# Patient Record
Sex: Male | Born: 1970 | Race: White | Hispanic: No | Marital: Married | Smoking: Current every day smoker
Health system: Southern US, Community
[De-identification: ages and names within clinical notes are randomized; demographics above are authoritative.]

## PROBLEM LIST (undated history)

## (undated) DIAGNOSIS — M549 Dorsalgia, unspecified: Secondary | ICD-10-CM

## (undated) DIAGNOSIS — F17200 Nicotine dependence, unspecified, uncomplicated: Secondary | ICD-10-CM

## (undated) DIAGNOSIS — Z720 Tobacco use: Secondary | ICD-10-CM

## (undated) DIAGNOSIS — M5137 Other intervertebral disc degeneration, lumbosacral region: Secondary | ICD-10-CM

## (undated) DIAGNOSIS — F4322 Adjustment disorder with anxiety: Secondary | ICD-10-CM

## (undated) DIAGNOSIS — R55 Syncope and collapse: Secondary | ICD-10-CM

## (undated) DIAGNOSIS — E119 Type 2 diabetes mellitus without complications: Secondary | ICD-10-CM

## (undated) DIAGNOSIS — M51379 Other intervertebral disc degeneration, lumbosacral region without mention of lumbar back pain or lower extremity pain: Secondary | ICD-10-CM

## (undated) DIAGNOSIS — F419 Anxiety disorder, unspecified: Secondary | ICD-10-CM

## (undated) DIAGNOSIS — R5383 Other fatigue: Secondary | ICD-10-CM

## (undated) DIAGNOSIS — G4733 Obstructive sleep apnea (adult) (pediatric): Secondary | ICD-10-CM

## (undated) HISTORY — DX: Other fatigue: R53.83

## (undated) HISTORY — DX: Other intervertebral disc degeneration, lumbosacral region without mention of lumbar back pain or lower extremity pain: M51.379

## (undated) HISTORY — DX: Obstructive sleep apnea (adult) (pediatric): G47.33

## (undated) HISTORY — DX: Tobacco use: Z72.0

## (undated) HISTORY — DX: Adjustment disorder with anxiety: F43.22

## (undated) HISTORY — DX: Nicotine dependence, unspecified, uncomplicated: F17.200

## (undated) HISTORY — DX: Type 2 diabetes mellitus without complications: E11.9

## (undated) HISTORY — DX: Anxiety disorder, unspecified: F41.9

## (undated) HISTORY — DX: Syncope and collapse: R55

## (undated) HISTORY — DX: Other intervertebral disc degeneration, lumbosacral region: M51.37

## (undated) HISTORY — PX: FINGER SURGERY: SHX640

## (undated) HISTORY — DX: Dorsalgia, unspecified: M54.9

---

## 2002-07-07 ENCOUNTER — Ambulatory Visit (HOSPITAL_BASED_OUTPATIENT_CLINIC_OR_DEPARTMENT_OTHER): Admission: RE | Admit: 2002-07-07 | Discharge: 2002-07-07 | Payer: Self-pay | Admitting: Family Medicine

## 2003-05-23 ENCOUNTER — Ambulatory Visit (HOSPITAL_COMMUNITY): Admission: RE | Admit: 2003-05-23 | Discharge: 2003-05-23 | Payer: Self-pay | Admitting: Orthopedic Surgery

## 2003-05-25 ENCOUNTER — Encounter (INDEPENDENT_AMBULATORY_CARE_PROVIDER_SITE_OTHER): Payer: Self-pay | Admitting: *Deleted

## 2003-05-25 ENCOUNTER — Ambulatory Visit (HOSPITAL_BASED_OUTPATIENT_CLINIC_OR_DEPARTMENT_OTHER): Admission: RE | Admit: 2003-05-25 | Discharge: 2003-05-25 | Payer: Self-pay | Admitting: Orthopedic Surgery

## 2009-03-29 ENCOUNTER — Emergency Department (HOSPITAL_BASED_OUTPATIENT_CLINIC_OR_DEPARTMENT_OTHER): Admission: EM | Admit: 2009-03-29 | Discharge: 2009-03-29 | Payer: Self-pay | Admitting: Emergency Medicine

## 2010-09-20 LAB — POCT CARDIAC MARKERS
CKMB, poc: <1 ng/mL — ABNORMAL LOW (ref 1.0–8.0)
Myoglobin, poc: 57.1 ng/mL (ref 12–200)
Troponin i, poc: <0.05 ng/mL (ref 0.00–0.09)

## 2010-09-20 LAB — COMPREHENSIVE METABOLIC PANEL
ALT: 45 U/L (ref 0–53)
Albumin: 5 g/dL (ref 3.5–5.2)
Alkaline Phosphatase: 97 U/L (ref 39–117)
Potassium: 3.9 mEq/L (ref 3.5–5.1)
Sodium: 143 mEq/L (ref 135–145)
Total Protein: 8 g/dL (ref 6.0–8.3)

## 2010-09-20 LAB — DIFFERENTIAL
Basophils Relative: 2 % — ABNORMAL HIGH (ref 0–1)
Eosinophils Absolute: 0.2 10*3/uL (ref 0.0–0.7)
Lymphs Abs: 2.9 10*3/uL (ref 0.7–4.0)
Monocytes Absolute: 0.5 10*3/uL (ref 0.1–1.0)
Monocytes Relative: 5 % (ref 3–12)
Neutro Abs: 6.1 10*3/uL (ref 1.7–7.7)

## 2010-09-20 LAB — CBC
Platelets: 204 10*3/uL (ref 150–400)
RDW: 12.3 % (ref 11.5–15.5)

## 2010-09-20 LAB — APTT: aPTT: 26 seconds (ref 24–37)

## 2010-09-20 LAB — GLUCOSE, CAPILLARY: Glucose-Capillary: 107 mg/dL — ABNORMAL HIGH (ref 70–99)

## 2010-11-02 NOTE — Op Note (Signed)
NAME:  Christopher Ray, Christopher Ray                        ACCOUNT NO.:  [id number]   MEDICAL RECORD NO.:  [id number]                   PATIENT TYPE:  AMB   LOCATION:  DSC                                  FACILITY:  [facility]   PHYSICIAN:  Artist Pais. Mina Marble, M.D.           DATE OF BIRTH:  [birth year]   DATE OF PROCEDURE:  05/25/2003  DATE OF DISCHARGE:                                 OPERATIVE REPORT   PREOPERATIVE DIAGNOSIS:  Left index finger mass.   POSTOPERATIVE DIAGNOSIS:  Left index finger mass.   PROCEDURE:  Excisional biopsy of left index finger mass.   SURGEON:  Artist Pais. Mina Marble, M.D.   ASSISTANT:  RN.   ANESTHESIA:  General.   TOURNIQUET TIME:  20 minutes.   No complications. No drains.   OPERATIVE REPORT:  The patient was taken to the operating room. After the  induction of anesthesia, left upper extremity was prepped and draped in  usual sterile fashion. Esmarch was used to exsanguinate the limb. Tourniquet  was inflated to 250 mmHg. At this point in time, a longitudinal incision was  made on the radial aspect of the proximal phalanx in between MP and PIP  joints over a large mass. Incision was taken down through the skin and  subcutaneous tissues. The mass seemed to be coming from the area between the  extensor tendon and the lateral band. This was carefully dissected free and  sent for pathological confirmation. Wound was then thoroughly irrigated.  Hemostasis was achieved with bipolar cautery, and the wound was then closed  with 5-0 nylon and simple sutures interrupted. The patient was placed in  sterile dressing with Xeroform, 4 x 4s, and Coban wrap. The patient  tolerated the procedure well and went to recovery in stable fashion.                                               Artist Pais Mina Marble, M.D.    MAW/MEDQ  D:  05/25/2003  T:  05/25/2003  Job:  [id number]

## 2011-07-29 ENCOUNTER — Other Ambulatory Visit: Payer: Self-pay

## 2011-07-29 ENCOUNTER — Emergency Department (INDEPENDENT_AMBULATORY_CARE_PROVIDER_SITE_OTHER): Payer: BC Managed Care – PPO

## 2011-07-29 ENCOUNTER — Encounter (HOSPITAL_BASED_OUTPATIENT_CLINIC_OR_DEPARTMENT_OTHER): Payer: Self-pay | Admitting: *Deleted

## 2011-07-29 ENCOUNTER — Emergency Department (HOSPITAL_BASED_OUTPATIENT_CLINIC_OR_DEPARTMENT_OTHER)
Admission: EM | Admit: 2011-07-29 | Discharge: 2011-07-29 | Disposition: A | Discharge status: Home / Self Care | Payer: BC Managed Care – PPO | Attending: Emergency Medicine | Admitting: Emergency Medicine

## 2011-07-29 DIAGNOSIS — M79609 Pain in unspecified limb: Secondary | ICD-10-CM | POA: Insufficient documentation

## 2011-07-29 DIAGNOSIS — R079 Chest pain, unspecified: Secondary | ICD-10-CM

## 2011-07-29 LAB — BASIC METABOLIC PANEL
BUN: 14 mg/dL (ref 6–23)
CO2: 27 mEq/L (ref 19–32)
Chloride: 103 mEq/L (ref 96–112)
Glucose, Bld: 129 mg/dL — ABNORMAL HIGH (ref 70–99)
Potassium: 3.7 mEq/L (ref 3.5–5.1)
Sodium: 139 mEq/L (ref 135–145)

## 2011-07-29 LAB — CBC
Hemoglobin: 15 g/dL (ref 13.0–17.0)
MCH: 30.9 pg (ref 26.0–34.0)
RBC: 4.85 MIL/uL (ref 4.22–5.81)
WBC: 9.5 10*3/uL (ref 4.0–10.5)

## 2011-07-29 LAB — DIFFERENTIAL
Eosinophils Absolute: 0.2 10*3/uL (ref 0.0–0.7)
Lymphocytes Relative: 25 % (ref 12–46)
Lymphs Abs: 2.4 10*3/uL (ref 0.7–4.0)
Monocytes Relative: 10 % (ref 3–12)
Neutrophils Relative %: 64 % (ref 43–77)

## 2011-07-29 LAB — CARDIAC PANEL(CRET KIN+CKTOT+MB+TROPI)
CK, MB: 2.2 ng/mL (ref 0.3–4.0)
Relative Index: 2.2 (ref 0.0–2.5)
Troponin I: <0.3 ng/mL (ref <0.30)

## 2011-07-29 LAB — TROPONIN I: Troponin I: <0.3 ng/mL (ref <0.30)

## 2011-07-29 MED ORDER — ASPIRIN 325 MG PO TABS: 325.0000 mg | ORAL_TABLET | ORAL | Status: DC

## 2011-07-29 MED ORDER — ASPIRIN 81 MG PO CHEW
CHEWABLE_TABLET | ORAL | Status: AC
  Administered 2011-07-29: 324 mg
  Filled 2011-07-29: qty 4

## 2011-07-29 MED ORDER — ASPIRIN 81 MG PO CHEW: 81.0000 mg | CHEWABLE_TABLET | Freq: Every day | ORAL | Status: AC

## 2011-07-29 NOTE — ED Notes (Signed)
Pt reports sudden onset of mid sternal CP radiating to left arm  and nausea denies SOB vomiting or diaphoresis

## 2011-07-29 NOTE — ED Notes (Signed)
Patient transported to X-ray 

## 2011-07-29 NOTE — ED Provider Notes (Signed)
History     CSN: [id number]  Arrival date & time 07/29/11  0043   First MD Initiated Contact with Patient 07/29/11 0102      Chief Complaint  Patient presents with  . Chest Pain    (Consider location/radiation/quality/duration/timing/severity/associated sxs/prior treatment) HPI Location left sided chest. Radiation to left arm. Quality described as pressure-like and "doesn't feel right". Onset around 11 PM while at rest. Last about 15 minutes. With associated with nausea but no vomiting. No shortness of breath or diaphoresis. No back pain. No leg pain or swelling. No cough cold or congestion. Patient was evaluated over year ago for similar symptoms and was told that may have been a panic attack. He does not get these symptoms frequently. He is a smoker. His father had a CABG in his 65s. Patient has been prescribed fish oil in the past but does not take that any longer. No history of hypertension or diabetes.  History reviewed. No pertinent past medical history.  History reviewed. No pertinent past surgical history.  Family History  Problem Relation Age of Onset  . Diabetes Father   . Heart failure Father     History  Substance Use Topics  . Smoking status: Never Smoker   . Smokeless tobacco: Not on file  . Alcohol Use: No      Review of Systems  Constitutional: Negative for fever and chills.  HENT: Negative for neck pain and neck stiffness.   Eyes: Negative for pain.  Respiratory: Negative for shortness of breath.   Cardiovascular: Positive for chest pain.  Gastrointestinal: Negative for abdominal pain.  Genitourinary: Negative for dysuria.  Musculoskeletal: Negative for back pain.  Skin: Negative for rash.  Neurological: Negative for headaches.  All other systems reviewed and are negative.    Allergies  Review of patient's allergies indicates no known allergies.  Home Medications  No current outpatient prescriptions on file.  BP 134/92  Pulse 97  Temp(Src)  98.2 F (36.8 C) (Oral)  Resp 18  Ht 5\' 9"  (1.753 m)  Wt 192 lb (87.091 kg)  BMI 28.35 kg/m2  SpO2 100%  Physical Exam  Constitutional: He is oriented to person, place, and time. He appears well-developed and well-nourished.  HENT:  Head: Normocephalic and atraumatic.  Eyes: Conjunctivae and EOM are normal. Pupils are equal, round, and reactive to light.  Neck: Trachea normal. Neck supple. No thyromegaly present.  Cardiovascular: Normal rate, regular rhythm, S1 normal, S2 normal and normal pulses.     No systolic murmur is present   No diastolic murmur is present  Pulses:      Radial pulses are 2+ on the right side, and 2+ on the left side.  Pulmonary/Chest: Effort normal and breath sounds normal. He has no wheezes. He has no rhonchi. He has no rales. He exhibits no tenderness.  Abdominal: Soft. Normal appearance and bowel sounds are normal. There is no tenderness. There is no CVA tenderness and negative Murphy's sign.  Musculoskeletal:       BLE:s Calves nontender, no cords or erythema, negative Homans sign  Neurological: He is alert and oriented to person, place, and time. He has normal strength. No cranial nerve deficit or sensory deficit. GCS eye subscore is 4. GCS verbal subscore is 5. GCS motor subscore is 6.  Skin: Skin is warm and dry. No rash noted. He is not diaphoretic.  Psychiatric: His speech is normal.       Cooperative and appropriate    ED Course  Procedures (  including critical care time)  Results for orders placed during the hospital encounter of 07/29/11  CBC      Component Value Range   WBC 9.5  4.0 - 10.5 (K/uL)   RBC 4.85  4.22 - 5.81 (MIL/uL)   Hemoglobin 15.0  13.0 - 17.0 (g/dL)   HCT 16.1  09.6 - 04.5 (%)   MCV 86.6  78.0 - 100.0 (fL)   MCH 30.9  26.0 - 34.0 (pg)   MCHC 35.7  30.0 - 36.0 (g/dL)   RDW 40.9  81.1 - 91.4 (%)   Platelets 201  150 - 400 (K/uL)  DIFFERENTIAL      Component Value Range   Neutrophils Relative 64  43 - 77 (%)   Neutro  Abs 6.0  1.7 - 7.7 (K/uL)   Lymphocytes Relative 25  12 - 46 (%)   Lymphs Abs 2.4  0.7 - 4.0 (K/uL)   Monocytes Relative 10  3 - 12 (%)   Monocytes Absolute 0.9  0.1 - 1.0 (K/uL)   Eosinophils Relative 2  0 - 5 (%)   Eosinophils Absolute 0.2  0.0 - 0.7 (K/uL)   Basophils Relative 0  0 - 1 (%)   Basophils Absolute 0.0  0.0 - 0.1 (K/uL)  BASIC METABOLIC PANEL      Component Value Range   Sodium 139  135 - 145 (mEq/L)   Potassium 3.7  3.5 - 5.1 (mEq/L)   Chloride 103  96 - 112 (mEq/L)   CO2 27  19 - 32 (mEq/L)   Glucose, Bld 129 (*) 70 - 99 (mg/dL)   BUN 14  6 - 23 (mg/dL)   Creatinine, Ser 7.82  0.50 - 1.35 (mg/dL)   Calcium 9.4  8.4 - 95.6 (mg/dL)   GFR calc non Af Amer >90  >90 (mL/min)   GFR calc Af Amer >90  >90 (mL/min)  CARDIAC PANEL(CRET KIN+CKTOT+MB+TROPI)      Component Value Range   Total CK 102  7 - 232 (U/L)   CK, MB 2.2  0.3 - 4.0 (ng/mL)   Troponin I <0.30  <0.30 (ng/mL)   Relative Index 2.2  0.0 - 2.5   TROPONIN I      Component Value Range   Troponin I <0.30  <0.30 (ng/mL)   Dg Chest 2 View  07/29/2011  *RADIOLOGY REPORT*  Clinical Data: Sudden onset of chest pain.  CHEST - 2 VIEW  Comparison: None.  Findings: The heart size is normal.  The lungs are clear.  The visualized soft tissues and bony thorax are unremarkable.  IMPRESSION: Negative chest.  Original Report Authenticated By: Jamesetta Orleans. MATTERN, M.D.     Date: 07/29/2011  Rate: 89  Rhythm: normal sinus rhythm  QRS Axis: normal  Intervals: normal  ST/T Wave abnormalities: nonspecific ST changes  Conduction Disutrbances:none  Narrative Interpretation:   Old EKG Reviewed: unchanged     MDM   Chest pain with low risk for ACS. Aspirin provided. No pain in the ED. Stat EKG reviewed no acute ischemia. Serial cardiac enzymes obtained and reviewed as above all within normal limits.  Patient agrees to close outpatient followup with stress testing. No clinical DVT or PE. Doubt infectious process. No  obvious GI etiology.          Sunnie Nielsen, MD 07/29/11 [phone]

## 2011-08-02 NOTE — ED Notes (Signed)
Patient called to clarify out patient follow up with cardiology.  Reviewed discharge instructions, patient to call for followup appointment with [facility] for possible out patient stress test.

## 2011-08-13 ENCOUNTER — Ambulatory Visit (INDEPENDENT_AMBULATORY_CARE_PROVIDER_SITE_OTHER): Payer: BC Managed Care – PPO | Admitting: Cardiovascular Disease

## 2011-08-13 ENCOUNTER — Encounter: Payer: Self-pay | Admitting: Cardiovascular Disease

## 2011-08-13 DIAGNOSIS — Z720 Tobacco use: Secondary | ICD-10-CM | POA: Insufficient documentation

## 2011-08-13 DIAGNOSIS — R079 Chest pain, unspecified: Secondary | ICD-10-CM | POA: Insufficient documentation

## 2011-08-13 NOTE — Assessment & Plan Note (Addendum)
His risk factors for CAD are family history of CAD and personal history of smoking. His BP is elevated today and his lipid status is unknown. Will arrange echo to assess LV function and exclude structural heart disease. Will also arrange exercise treadmill stress test to exclude ischemia. Will check fasting lipids. Repeat BP at next visit and start anti-hypertensive therapy if BP still elevated.

## 2011-08-13 NOTE — Progress Notes (Signed)
   History of Present Illness: 41 yo WM with no significant past medical history who is here today as a self referral for evaluation of chest pain. He was seen in the ED at [facility] 07/29/11 with c/o chest pain. He describes pressure in the center of his chest, associated with diaphoresis, SOB and feeling poorly. This lasted for about 15 minutes. He was seen in the ED and had negative cardiac enzymes. EKG was ok. He has had several other episodes. He has no history of HTN, HLD, DM. He does smoke and has smoked 1ppd for 17 years. No exertional chest pain.   Primary Care Physician: Windle Guard ( he has not been seen in primary care in years)   Past Medical History  Diagnosis Date  . Chest pain     Past Surgical History  Procedure Date  . Finger surgery     Current Outpatient Prescriptions  Medication Sig Dispense Refill  . fish oil-omega-3 fatty acids 1000 MG capsule Take 1 g by mouth daily.      Marland Kitchen aspirin 81 MG chewable tablet Chew 1 tablet (81 mg total) by mouth daily.  30 tablet  0    No Known Allergies  History   Social History  . Marital Status: Married    Spouse Name: N/A    Number of Children: 2  . Years of Education: N/A   Occupational History  . 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 Smoking status: Current Everyday Smoker -- 1.0 packs/day for 17 years    Types: Cigarettes  . Smokeless tobacco: Not on file  . Alcohol Use: No  . Drug Use: No  . Sexually Active: Not on file   Other Topics Concern  . Not on file   Social History Narrative  . No narrative on file    Family History  Problem Relation Age of Onset  . Heart disease Father     father had CABG in 56's  . Heart attack Father     87s    Review of Systems:  As stated in the HPI and otherwise negative.   BP 149/89  Pulse 102  Ht 5\' 8"  (1.727 m)  Wt 192 lb (87.091 kg)  BMI 29.19 kg/m2  Physical Examination: General: Well developed, well nourished, NAD HEENT: OP  clear, mucus membranes moist SKIN: warm, dry. No rashes. Neuro: No focal deficits Musculoskeletal: Muscle strength 5/5 all ext Psychiatric: Mood and affect normal Neck: No JVD, no carotid bruits, no thyromegaly, no lymphadenopathy. Lungs:Clear bilaterally, no wheezes, rhonci, crackles Cardiovascular: Regular rate and rhythm. No murmurs, gallops or rubs. Abdomen:Soft. Bowel sounds present. Non-tender.  Extremities: No lower extremity edema. Pulses are 2 + in the bilateral DP/PT.  EKG: Sinus tach, rate 102 bpm.

## 2011-08-13 NOTE — Assessment & Plan Note (Signed)
He wishes to stop smoking. Will start Chantix per dosing protocol for 12 weeks.

## 2011-08-13 NOTE — Patient Instructions (Addendum)
Your physician recommends that you schedule a follow-up appointment in: 3-4 weeks.   Your physician has requested that you have an echocardiogram. Echocardiography is a painless test that uses sound waves to create images of your heart. It provides your doctor with information about the size and shape of your heart and how well your heart's chambers and valves are working. This procedure takes approximately one hour. There are no restrictions for this procedure.   Your physician has requested that you have an exercise tolerance test. For further information please visit [email or web address] Please also follow instruction sheet, as given.   Your physician has recommended you make the following change in your medication: Start chantix--use as directed   Your physician recommends that you return for fasting lab work on day of stress test

## 2011-08-19 ENCOUNTER — Other Ambulatory Visit (INDEPENDENT_AMBULATORY_CARE_PROVIDER_SITE_OTHER): Payer: BC Managed Care – PPO

## 2011-08-19 ENCOUNTER — Other Ambulatory Visit: Payer: Self-pay

## 2011-08-19 ENCOUNTER — Ambulatory Visit (HOSPITAL_COMMUNITY): Payer: BC Managed Care – PPO | Attending: Internal Medicine

## 2011-08-19 DIAGNOSIS — R079 Chest pain, unspecified: Secondary | ICD-10-CM | POA: Insufficient documentation

## 2011-08-19 DIAGNOSIS — R072 Precordial pain: Secondary | ICD-10-CM

## 2011-08-19 DIAGNOSIS — F17200 Nicotine dependence, unspecified, uncomplicated: Secondary | ICD-10-CM | POA: Insufficient documentation

## 2011-08-19 DIAGNOSIS — R0609 Other forms of dyspnea: Secondary | ICD-10-CM | POA: Insufficient documentation

## 2011-08-19 DIAGNOSIS — R0989 Other specified symptoms and signs involving the circulatory and respiratory systems: Secondary | ICD-10-CM | POA: Insufficient documentation

## 2011-08-19 LAB — LIPID PANEL
Cholesterol: 186 mg/dL (ref 0–200)
Triglycerides: 154 mg/dL — ABNORMAL HIGH (ref 0.0–149.0)

## 2011-08-20 ENCOUNTER — Telehealth: Payer: Self-pay | Admitting: Cardiovascular Disease

## 2011-08-20 NOTE — Telephone Encounter (Signed)
Spoke with pt and reviewed lipid results and information from Dr. Clifton James

## 2011-08-20 NOTE — Telephone Encounter (Signed)
Fu call °Patient returning your call °

## 2011-08-26 ENCOUNTER — Ambulatory Visit (INDEPENDENT_AMBULATORY_CARE_PROVIDER_SITE_OTHER): Payer: BC Managed Care – PPO | Admitting: Physician Assistant

## 2011-08-26 DIAGNOSIS — R079 Chest pain, unspecified: Secondary | ICD-10-CM

## 2011-08-26 NOTE — Progress Notes (Signed)
Exercise Treadmill Test  Pre-Exercise Testing Evaluation Rhythm: normal sinus  Rate: 90   PR:  .12 QRS:  .08  QT:  .34 QTc: .42     Test  Exercise Tolerance Test Ordering MD: Melene Muller, MD  Interpreting MD:  Tereso Newcomer PA-C  Unique Test No: 1  Treadmill:  1  Indication for ETT: chest pain - rule out ischemia  Contraindication to ETT: No   Stress Modality: exercise - treadmill  Cardiac Imaging Performed: non   Protocol: standard Bruce - maximal  Max BP:  172/77  Max MPHR (bpm):  181 85% MPR (bpm):  153  MPHR obtained (bpm): 160 % MPHR obtained:  87%  Reached 85% MPHR (min:sec):  7:49 Total Exercise Time (min-sec):  8:19  Workload in METS:  10.1 Borg Scale: 17  Reason ETT Terminated:  patient's desire to stop    ST Segment Analysis At Rest: normal ST segments - no evidence of significant ST depression With Exercise: no evidence of significant ST depression  Other Information Arrhythmia:  No Angina during ETT:  absent (0) Quality of ETT:  diagnostic  ETT Interpretation:  normal - no evidence of ischemia by ST analysis  Comments: Good exercise tolerance. No chest pain. Normal BP response to exercise. No ST-T changes to suggest ischemia.   Recommendations: Follow up with Dr. Verne Carrow as directed. Tereso Newcomer, PA-C  3:16 PM 08/26/2011

## 2011-09-09 ENCOUNTER — Ambulatory Visit: Payer: BC Managed Care – PPO | Admitting: Cardiovascular Disease

## 2012-01-22 ENCOUNTER — Emergency Department (HOSPITAL_COMMUNITY)
Admission: EM | Admit: 2012-01-22 | Discharge: 2012-01-22 | Disposition: A | Discharge status: Home Health | Payer: BC Managed Care – PPO | Attending: Emergency Medicine | Admitting: Emergency Medicine

## 2012-01-22 ENCOUNTER — Emergency Department (HOSPITAL_COMMUNITY): Payer: BC Managed Care – PPO

## 2012-01-22 ENCOUNTER — Encounter (HOSPITAL_COMMUNITY): Payer: Self-pay | Admitting: Emergency Medicine

## 2012-01-22 ENCOUNTER — Other Ambulatory Visit: Payer: Self-pay

## 2012-01-22 DIAGNOSIS — R079 Chest pain, unspecified: Secondary | ICD-10-CM | POA: Insufficient documentation

## 2012-01-22 DIAGNOSIS — F411 Generalized anxiety disorder: Secondary | ICD-10-CM | POA: Insufficient documentation

## 2012-01-22 DIAGNOSIS — F17200 Nicotine dependence, unspecified, uncomplicated: Secondary | ICD-10-CM | POA: Insufficient documentation

## 2012-01-22 DIAGNOSIS — F419 Anxiety disorder, unspecified: Secondary | ICD-10-CM

## 2012-01-22 LAB — BASIC METABOLIC PANEL
BUN: 12 mg/dL (ref 6–23)
Creatinine, Ser: 0.92 mg/dL (ref 0.50–1.35)
GFR calc Af Amer: >90 mL/min (ref >90)
GFR calc non Af Amer: >90 mL/min (ref >90)
Potassium: 3.6 mEq/L (ref 3.5–5.1)

## 2012-01-22 LAB — CBC
Hemoglobin: 14.8 g/dL (ref 13.0–17.0)
MCHC: 35.2 g/dL (ref 30.0–36.0)
RDW: 12.5 % (ref 11.5–15.5)
WBC: 6.5 10*3/uL (ref 4.0–10.5)

## 2012-01-22 LAB — POCT I-STAT TROPONIN I: Troponin i, poc: 0 ng/mL (ref 0.00–0.08)

## 2012-01-22 MED ORDER — LORAZEPAM 1 MG PO TABS
1.0000 mg | ORAL_TABLET | Freq: Once | ORAL | Status: AC
  Administered 2012-01-22: 1 mg via ORAL
  Filled 2012-01-22: qty 1

## 2012-01-22 NOTE — ED Provider Notes (Signed)
History     CSN: [id number]  Arrival date & time 01/22/12  0847   First MD Initiated Contact with Patient 01/22/12 [phone]      Chief Complaint  Patient presents with  . Chest Pain    (Consider location/radiation/quality/duration/timing/severity/associated sxs/prior treatment) Patient is a 41 y.o. male presenting with chest pain.  Chest Pain Primary symptoms include nausea. Pertinent negatives for primary symptoms include no fever, no shortness of breath, no cough, no palpitations, no abdominal pain and no vomiting.  Pertinent negatives for associated symptoms include no numbness.   History from patient. Patient who is a smoker presents with chest pain. He states this started this morning while he was standing at work and lasted for a total of about 10 minutes. It was located to the generalized chest without radiation to the back or abdomen or arm. Pain was described as sharp in nature. No associated shortness of breath, diaphoresis, palpitations, leg swelling or pain. He did have associated nausea without vomiting. Patient does have a positive family history of coronary artery disease. His father had an MI in his late 11s. Patient reports that he has had the same pain in the past. He believes that it may be secondary to anxiety. States "it usually starts when I am by myself thinking about things." He has been seen by cardiology previously for this and has had a negative stress test and echocardiogram within the last 6 months. Symptoms are resolved at present.  Past Medical History  Diagnosis Date  . Chest pain     Past Surgical History  Procedure Date  . Finger surgery     Family History  Problem Relation Age of Onset  . Heart disease Father     father had CABG in 27's  . Heart attack Father     28s    History  Substance Use Topics  . Smoking status: Current Everyday Smoker -- 1.0 packs/day for 17 years    Types: Cigarettes  . Smokeless tobacco: Not on file  . Alcohol Use:  No      Review of Systems  Constitutional: Negative for fever and chills.  Respiratory: Negative for cough and shortness of breath.   Cardiovascular: Positive for chest pain. Negative for palpitations and leg swelling.       Currently CP free  Gastrointestinal: Positive for nausea. Negative for vomiting and abdominal pain.  Skin: Negative for color change and rash.  Neurological: Negative for numbness.  All other systems reviewed and are negative.    Allergies  Review of patient's allergies indicates no known allergies.  Home Medications   Current Outpatient Rx  Name Route Sig Dispense Refill  . ASPIRIN 81 MG PO CHEW Oral Chew 1 tablet (81 mg total) by mouth daily. 30 tablet 0    BP 136/84  Pulse 101  Temp 98 F (36.7 C) (Oral)  Resp 20  SpO2 97%  Physical Exam  Nursing note and vitals reviewed. Constitutional: He appears well-developed and well-nourished. No distress.  HENT:  Head: Normocephalic and atraumatic.  Eyes:       Normal appearance  Neck: Normal range of motion.  Cardiovascular: Normal rate, regular rhythm and normal heart sounds.        Initially tachycardic with rate ~108, rate of 90 on my exam  Pulmonary/Chest: Effort normal and breath sounds normal. He exhibits no tenderness.  Abdominal: Soft. Bowel sounds are normal. There is no tenderness. There is no rebound and no guarding.  Musculoskeletal: Normal range  of motion. He exhibits no edema.  Neurological: He is alert.  Skin: Skin is warm and dry. He is not diaphoretic.  Psychiatric: He has a normal mood and affect.    ED Course  Procedures (including critical care time)   Date: 01/22/2012  Rate: 76  Rhythm: normal sinus rhythm  QRS Axis: normal  Intervals: normal  ST/T Wave abnormalities: normal  Conduction Disutrbances:none  Narrative Interpretation: early precordial R wave progression  Old EKG Reviewed: none available  Labs Reviewed  BASIC METABOLIC PANEL - Abnormal; Notable for the  following:    Glucose, Bld 111 (*)     All other components within normal limits  CBC  POCT I-STAT TROPONIN I   Dg Chest 2 View  01/22/2012  *RADIOLOGY REPORT*  Clinical Data: Chest discomfort  CHEST - 2 VIEW  Comparison: 07/29/2011  Findings: The lungs are clear without focal consolidation, edema, effusion or pneumothorax.  Cardiopericardial silhouette is within normal limits for size.  Imaged bony structures of the thorax are intact.  IMPRESSION: No acute cardiopulmonary process.  Stable exam.  Original Report Authenticated By: ERIC A. MANSELL, M.D.     1. Chest pain   2. Anxiety       MDM  Patient presents with reported 10 minute episode of chest pain this morning. Associated with nausea. No other symptoms. He has had similar before and has been seen by cardiology for this and had a. Her workup including an echocardiogram and a stress test which were both negative. These were performed within the past 6 months. Patient is currently pain free. EKG is nonischemic appearing. Patient's lab evaluation and x-rays are normal appearing. Patient was given Ativan and felt better with this. Question whether some of this may be anxiety related. I do not suspect ACS, dissection or pulmonary embolus as the etiology for the patient's symptoms. Patient instructed to make a followup appointment with his primary care doctor for further evaluation and treatment. Reasons to return discussed.        Grant Fontana, PA-C 01/22/12 1307

## 2012-01-22 NOTE — ED Provider Notes (Signed)
Medical screening examination/treatment/procedure(s) were performed by non-physician practitioner and as supervising physician I was immediately available for consultation/collaboration.   Jayceon Troy M Brookelynne Dimperio, MD 01/22/12 2139 

## 2012-01-22 NOTE — ED Notes (Signed)
Pt c/o midsternal CP with N/V starting today; pt denies SOB

## 2012-01-30 ENCOUNTER — Ambulatory Visit (INDEPENDENT_AMBULATORY_CARE_PROVIDER_SITE_OTHER): Payer: BC Managed Care – PPO | Admitting: Nurse Practitioner

## 2012-01-30 ENCOUNTER — Encounter: Payer: Self-pay | Admitting: Nurse Practitioner

## 2012-01-30 VITALS — BP 122/82 | HR 91 | Ht 68.0 in | Wt 186.8 lb

## 2012-01-30 DIAGNOSIS — Z720 Tobacco use: Secondary | ICD-10-CM

## 2012-01-30 DIAGNOSIS — R55 Syncope and collapse: Secondary | ICD-10-CM

## 2012-01-30 DIAGNOSIS — R079 Chest pain, unspecified: Secondary | ICD-10-CM

## 2012-01-30 DIAGNOSIS — F17200 Nicotine dependence, unspecified, uncomplicated: Secondary | ICD-10-CM

## 2012-01-30 NOTE — Patient Instructions (Addendum)
Your physician recommends that you continue on your current medications as directed. Please refer to the Current Medication list given to you today.  Your physician has recommended that you wear 21 DAY event monitor. Event monitors are medical devices that record the heart's electrical activity. Doctors most often [place] these monitors to diagnose arrhythmias. Arrhythmias are problems with the speed or rhythm of the heartbeat. The monitor is a small, portable device. You can wear one while you do your normal daily activities. This is usually used to diagnose what is causing palpitations/syncope (passing out). Per Ward Givens, NP, DX: pre syncope  Your physician recommends that you schedule a follow-up appointment with Dr. Clifton James in one month

## 2012-01-30 NOTE — Progress Notes (Signed)
Patient Name: Christopher Ray Date of Encounter: 01/30/2012  Primary Care Provider:  Kaleen Mask, MD Primary Cardiologist:  C. Clifton James, MD  Patient Profile  41 y/o male with h/o chest pain who presents for f/u.  Problem List   Past Medical History  Diagnosis Date  . Chest pain     a. 08/2011 Echo: EF 60-65%, nl wall motion;  b. 08/2011 Normal ETT:  walked 8:19 w/o ST/T changes.  . Tobacco abuse   . Fatigue   . Pre-syncope    Past Surgical History  Procedure Date  . Finger surgery     Allergies  No Known Allergies  HPI  41 y/o male with the above problem list.  He was last seen in Feb of this year with complaints of chest pain.  This was followed by an echo and ETT, both of which were normal.  Since then, he has continued to have intermittent fatigue, wkns, dizziness/lightheadedness/presyncope, nausea, vomiting, and sharp shooting chest pains.  He can't really pin down how frequently any of these Ss occur, saying only that any one symptom may come on suddenly.  He may go a week or so w/o any symptoms and other times he has dizzy spells several days in a row.  He has never passed out.  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  He was recently seen in the ED (last week) following a sudden episode of nausea and vomiting, and work up there was unrevealing.  Home Medications  Prior to Admission medications   Medication Sig Start Date End Date Taking? Authorizing Provider  aspirin 81 MG chewable tablet Chew 1 tablet (81 mg total) by mouth daily. 07/29/11 07/28/12 Yes Sunnie Nielsen, MD  fish oil-omega-3 fatty acids 1000 MG capsule Take 1 g by mouth daily.   Yes Historical Provider, MD    Review of Systems  As above, pt has periodic sharp/fleeting chest  pain, occasional fatigue/wkns, periodic and sudden nausea and vomiting, occas LH/presyncope.  All other systems reviewed and are otherwise negative except as noted above.  Physical Exam  Blood pressure 122/82, pulse 91, height 5\' 8"  (1.727 m), weight 186 lb 12.8 oz (84.732 kg).  General: Pleasant, NAD Psych: Normal affect. Neuro: Alert and oriented X 3. Moves all extremities spontaneously. HEENT: Normal  Neck: Supple without bruits or JVD. Lungs:  Resp regular and unlabored, CTA. Heart: RRR no s3, s4, or murmurs. Abdomen: Soft, non-tender, non-distended, BS + x 4.  Extremities: No clubbing, cyanosis or edema. DP/PT/Radials 2+ and equal bilaterally.  Accessory Clinical Findings  ECG - RSR, 90, no acute st/t changes.  Assessment & Plan  1.  Presyncope:  Pt with fairly vague symptoms of intermittent and sometimes sudden fatigue/wkns, dizziness, pre-syncope, nausea, vomiting.  He has never passed out.  He has never had palpitations associated with any of these symptoms.  We will place a 21 day event monitor to hopefully exclude arrhythmia as a possible contributor, specifically to his presyncope.  He has already had a nl echo and ETT in March of this year.  2.  Chest pain:  He has a fairly long h/o atypical chest pain and at this point, if anything, this has improved.  Negative ETT in March.  3.  Tobacco abuse:  Cessation advised.  He says that he has cut back but does not have specific plans to quite.  4.  Anxiety:  Likely playing a role in his Ss.  He plans to w/u with his PCP.  5.  Dispo:  F/u Dr. Clifton James in approx 1 month.  Nicolasa Ducking, NP 01/30/2012, 12:47 PM

## 2012-01-31 ENCOUNTER — Encounter (INDEPENDENT_AMBULATORY_CARE_PROVIDER_SITE_OTHER): Payer: BC Managed Care – PPO

## 2012-01-31 DIAGNOSIS — R079 Chest pain, unspecified: Secondary | ICD-10-CM

## 2012-01-31 DIAGNOSIS — R55 Syncope and collapse: Secondary | ICD-10-CM

## 2012-02-25 ENCOUNTER — Encounter: Payer: Self-pay | Admitting: Internal Medicine

## 2012-02-25 ENCOUNTER — Ambulatory Visit (INDEPENDENT_AMBULATORY_CARE_PROVIDER_SITE_OTHER): Payer: BC Managed Care – PPO | Admitting: Internal Medicine

## 2012-02-25 VITALS — BP 120/82 | HR 84 | Temp 97.9°F | Ht 69.25 in | Wt 187.0 lb

## 2012-02-25 DIAGNOSIS — F419 Anxiety disorder, unspecified: Secondary | ICD-10-CM

## 2012-02-25 DIAGNOSIS — F411 Generalized anxiety disorder: Secondary | ICD-10-CM

## 2012-02-25 DIAGNOSIS — M2550 Pain in unspecified joint: Secondary | ICD-10-CM

## 2012-02-25 MED ORDER — ALPRAZOLAM 0.5 MG PO TABS: 0.5000 mg | ORAL_TABLET | Freq: Three times a day (TID) | ORAL | Status: AC | PRN

## 2012-02-25 NOTE — Progress Notes (Signed)
  Subjective:    Patient ID: Christopher Ray, male    DOB: 19-Mar-1971, 41 y.o.   MRN: [id number]  HPI New patient One-year history of on and off episodes described as follows: Feels like everything is racing, his heart goes rapid, he can't relax, "I need to get out off the house like everything is closing in". Sometimes symptoms are associated with sweats and dizziness. No associated headache, nausea, vomiting, loss of consciousness. No obvious triggers. Symptoms may be weekly or monthly, they are unpredictable. Status post cardiology eval,  chart is reviewed, see assessment and plan.  Also for the last 4 months has been having aches and pains, the patient has a hard time describing them, "like a discomfort" he points to his wrists , hands and calves. Denies any fever chills, no weight loss. No neck or back pain.  Past Medical History  Diagnosis Date  . Chest pain     a. 08/2011 Echo: EF 60-65%, nl wall motion;  b. 08/2011 Normal ETT:  walked 8:19 w/o ST/T changes.  . Tobacco abuse   . Fatigue   . Pre-syncope    Past Surgical History  Procedure Date  . Finger surgery    History   Social History  . Marital Status: Married    Spouse Name: N/A    Number of Children: 2  . Years of Education: N/A   Occupational History  . 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 Smoking status: Current Everyday Smoker -- 1.0 packs/day for 17 years    Types: Cigarettes  . Smokeless tobacco: Never Used  . Alcohol Use: Yes     rarely   . Drug Use: No  . Sexually Active: Not on file   Other Topics Concern  . Not on file   Social History Narrative   Divorced, 2 children--- works at [employer]   Family History  Problem Relation Age of Onset  . Heart disease Father     father had CABG in 45's  . Heart attack Father     19s  . Diabetes      F, GM  . Colon cancer Neg Hx   . Prostate cancer Neg Hx       Review of Systems See history of present illness   Objective:   Physical Exam  General -- alert, well-developed, and well-nourished.   Neck --no thyromegaly , normal carotid pulse Lungs -- normal respiratory effort, no intercostal retractions, no accessory muscle use, and normal breath sounds.   Heart-- normal rate, regular rhythm, no murmur, and no gallop.   Abdomen--soft, non-tender, no distention, no masses, no HSM, no guarding, and no rigidity.   Extremities-- no pretibial edema bilaterally; hands and wrists normal to inspection and palpation Neurologic-- alert & oriented X3 ; DTRs and strength normal in all extremities. Speech and gait normal Psych-- Cognition and judgment appear intact. Alert and cooperative with normal attention span and concentration.  Slightly anxious but not depressed appearing.       Assessment & Plan:   Today , I spent more than 30  min with the patient, >50% of the time counseling, and /or reviewing the chart and labs ordered by other providers

## 2012-02-25 NOTE — Assessment & Plan Note (Addendum)
One year history of episodes as described above. Status post cardiology evaluation, in the last few months he had a normal echocardiogram, stress test, BMP, CBC. Last month he did a event monitor and was told it was negative. The differential diagnosis for his episodes definitely include anxiety; other consideration is thyroid disease. I think is reasonable to start anxiety treatment on a trial basis. We discussed SSRIs versus Xanax as needed, he does not like to take anything daily consequently we will start Xanax when necessary. Also will check TSH. Reassess in 4-6 weeks

## 2012-02-25 NOTE — Assessment & Plan Note (Signed)
Ill-defined discomfort in the wrists and calves, exam without synovitis. Will check a sedimentation rate and a total CK. Reassess and return to the office

## 2012-02-25 NOTE — Patient Instructions (Addendum)
If you have anxiety, try Xanax up to 3 times a day. Will cause drowsiness, do not operate machinery  afterwards. Come back in 4-6 weeks for a physical exam.

## 2012-02-26 ENCOUNTER — Ambulatory Visit: Payer: BC Managed Care – PPO | Admitting: Internal Medicine

## 2012-02-26 LAB — TSH: TSH: 1.63 u[IU]/mL (ref 0.35–5.50)

## 2012-02-28 ENCOUNTER — Encounter: Payer: Self-pay | Admitting: *Deleted

## 2012-03-12 ENCOUNTER — Ambulatory Visit: Payer: BC Managed Care – PPO | Admitting: Cardiovascular Disease

## 2012-04-14 ENCOUNTER — Encounter: Payer: Self-pay | Admitting: Internal Medicine

## 2012-04-14 ENCOUNTER — Ambulatory Visit (INDEPENDENT_AMBULATORY_CARE_PROVIDER_SITE_OTHER): Payer: BC Managed Care – PPO | Admitting: Internal Medicine

## 2012-04-14 VITALS — BP 118/74 | HR 103 | Temp 98.4°F | Ht 68.5 in | Wt 184.0 lb

## 2012-04-14 DIAGNOSIS — Z0000 Encounter for general adult medical examination without abnormal findings: Secondary | ICD-10-CM

## 2012-04-14 DIAGNOSIS — F419 Anxiety disorder, unspecified: Secondary | ICD-10-CM

## 2012-04-14 DIAGNOSIS — M2550 Pain in unspecified joint: Secondary | ICD-10-CM

## 2012-04-14 MED ORDER — LORAZEPAM 0.5 MG PO TABS: 0.5000 mg | ORAL_TABLET | Freq: Two times a day (BID) | ORAL | Status: DC | PRN

## 2012-04-14 MED ORDER — PREDNISONE 10 MG PO TABS: ORAL_TABLET | ORAL | Status: DC

## 2012-04-14 NOTE — Assessment & Plan Note (Addendum)
Tdap today Declined a flu shot  Never had a cscope  STE Labs reviewed, will check FLP, AST ALT; also a Testosterone (reports decreased libido) Has a family history of heart disease, recommend a healthy diet and stay active physically.

## 2012-04-14 NOTE — Patient Instructions (Addendum)
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 Watch for drowsiness. ---- Next visit one year and as needed

## 2012-04-14 NOTE — Assessment & Plan Note (Signed)
Today complains of back pain, and no radicular features. Plan: Steroids Tylenol as needed Will call if not better Avoid ice, rather use a heating pad

## 2012-04-14 NOTE — Assessment & Plan Note (Signed)
Patient took Xanax, caused some dizzines the next day. Will try Ativan instead. Either half or one tablet twice a day

## 2012-04-14 NOTE — Progress Notes (Signed)
  Subjective:    Patient ID: Christopher Ray, male    DOB: Oct 30, 1970, 41 y.o.   MRN: [id number]  HPI CPX  Past Medical History  Diagnosis Date  . Chest pain     a. 08/2011 Echo: EF 60-65%, nl wall motion;  b. 08/2011 Normal ETT:  walked 8:19 w/o ST/T changes.  . Tobacco abuse   . Fatigue   . Pre-syncope   . Anxiety    Past Surgical History  Procedure Date  . Finger surgery     L index    History   Social History  . Marital Status: Married    Spouse Name: N/A    Number of Children: 2  . Years of Education: N/A   Occupational History  . 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 Smoking status: Current Every Day Smoker -- 1.0 packs/day for 17 years    Types: Cigarettes  . Smokeless tobacco: Never Used  . Alcohol Use: Yes     rarely   . Drug Use: No  . Sexually Active: Not on file   Other Topics Concern  . Not on file   Social History Narrative   Divorced, 2 children--- works at [employer]: better compared to 3 years ago, has cut down on fried food---exercise: active at work, no routine exercise    Family History  Problem Relation Age of Onset  . Heart disease Father     father had CABG in 88's  . Heart attack Father     13s  . Diabetes      F, GM  . Colon cancer Neg Hx   . Prostate cancer Neg Hx      Review of Systems Since the last time he was here, he took Xanax for  anxiety, the next day he felt dizzy and slightly confused. Denies chest pain or shortness of breath No nausea, vomiting, diarrhea. No blood in the stools. No dysuria or gross hematuria. Also 5 days history of back pain, lower, bilateral, occasional radiation to both legs (posterior tight), worse with certain positions. Denies any fever, chills, rash in the back, bladder or bowel incontinence.     Objective:   Physical Exam General -- alert, well-developed, and well-nourished.   Neck --no thyromegaly  Lungs -- normal respiratory effort, no intercostal  retractions, no accessory muscle use, and normal breath sounds.   Heart-- normal rate, regular rhythm, no murmur, and no gallop.   Abdomen--soft, non-tender, no distention, no masses, no HSM, no guarding, and no rigidity.   Extremities-- no pretibial edema bilaterally Back--slightly tender at the lumbar spine. No tender at the sacroiliac joints Neurologic-- alert & oriented X3, DTRs and strength normal in all extremities. Straight leg test negative. Gait normal, posture are somehow antalgic Psych-- Cognition and judgment appear intact. Alert and cooperative with normal attention span and concentration.  not anxious appearing and not depressed appearing.       Assessment & Plan:

## 2012-04-17 ENCOUNTER — Emergency Department (HOSPITAL_BASED_OUTPATIENT_CLINIC_OR_DEPARTMENT_OTHER): Payer: BC Managed Care – PPO

## 2012-04-17 ENCOUNTER — Encounter (HOSPITAL_BASED_OUTPATIENT_CLINIC_OR_DEPARTMENT_OTHER): Payer: Self-pay

## 2012-04-17 ENCOUNTER — Ambulatory Visit: Payer: BC Managed Care – PPO | Admitting: Family Medicine

## 2012-04-17 ENCOUNTER — Emergency Department (HOSPITAL_BASED_OUTPATIENT_CLINIC_OR_DEPARTMENT_OTHER)
Admission: EM | Admit: 2012-04-17 | Discharge: 2012-04-17 | Disposition: A | Discharge status: Home / Self Care | Payer: BC Managed Care – PPO | Attending: Emergency Medicine | Admitting: Emergency Medicine

## 2012-04-17 ENCOUNTER — Telehealth: Payer: Self-pay

## 2012-04-17 DIAGNOSIS — Z8679 Personal history of other diseases of the circulatory system: Secondary | ICD-10-CM | POA: Insufficient documentation

## 2012-04-17 DIAGNOSIS — Z8669 Personal history of other diseases of the nervous system and sense organs: Secondary | ICD-10-CM | POA: Insufficient documentation

## 2012-04-17 DIAGNOSIS — F17200 Nicotine dependence, unspecified, uncomplicated: Secondary | ICD-10-CM | POA: Insufficient documentation

## 2012-04-17 DIAGNOSIS — M545 Low back pain, unspecified: Secondary | ICD-10-CM | POA: Insufficient documentation

## 2012-04-17 DIAGNOSIS — Z7982 Long term (current) use of aspirin: Secondary | ICD-10-CM | POA: Insufficient documentation

## 2012-04-17 DIAGNOSIS — M549 Dorsalgia, unspecified: Secondary | ICD-10-CM

## 2012-04-17 DIAGNOSIS — Z79899 Other long term (current) drug therapy: Secondary | ICD-10-CM | POA: Insufficient documentation

## 2012-04-17 DIAGNOSIS — F411 Generalized anxiety disorder: Secondary | ICD-10-CM | POA: Insufficient documentation

## 2012-04-17 DIAGNOSIS — Z87828 Personal history of other (healed) physical injury and trauma: Secondary | ICD-10-CM | POA: Insufficient documentation

## 2012-04-17 MED ORDER — KETOROLAC TROMETHAMINE 60 MG/2ML IM SOLN
60.0000 mg | Freq: Once | INTRAMUSCULAR | Status: AC
  Administered 2012-04-17: 60 mg via INTRAMUSCULAR
  Filled 2012-04-17: qty 2

## 2012-04-17 MED ORDER — HYDROCODONE-ACETAMINOPHEN 5-325 MG PO TABS: 2.0000 | ORAL_TABLET | ORAL | Status: DC | PRN

## 2012-04-17 MED ORDER — HYDROMORPHONE HCL PF 2 MG/ML IJ SOLN
2.0000 mg | Freq: Once | INTRAMUSCULAR | Status: AC
  Administered 2012-04-17: 2 mg via INTRAMUSCULAR
  Filled 2012-04-17: qty 1

## 2012-04-17 MED ORDER — CYCLOBENZAPRINE HCL 10 MG PO TABS: 10.0000 mg | ORAL_TABLET | Freq: Two times a day (BID) | ORAL | Status: DC | PRN

## 2012-04-17 NOTE — Telephone Encounter (Signed)
Can you schedule pt an office visit and call pt to advise. See previous notes.   MW

## 2012-04-17 NOTE — Telephone Encounter (Signed)
apparently pain is quite severe, needs to be seen
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
## 2012-04-17 NOTE — ED Notes (Signed)
Pt reports back pain that started 1 week ago.  He was seen by [facility], started on Prednisone but not any better.

## 2012-04-17 NOTE — Telephone Encounter (Signed)
Pt states was in for back pain has been taking the tylenol but not working and getting worse. Pt asked should he come back in or what should he do? Plz advise     MW

## 2012-04-17 NOTE — ED Provider Notes (Signed)
Medical screening examination/treatment/procedure(s) were performed by non-physician practitioner and as supervising physician I was immediately available for consultation/collaboration.  Shelda Jakes, MD 04/17/12 [phone]

## 2012-04-19 ENCOUNTER — Emergency Department (HOSPITAL_BASED_OUTPATIENT_CLINIC_OR_DEPARTMENT_OTHER): Payer: BC Managed Care – PPO

## 2012-04-19 ENCOUNTER — Emergency Department (HOSPITAL_BASED_OUTPATIENT_CLINIC_OR_DEPARTMENT_OTHER)
Admission: EM | Admit: 2012-04-19 | Discharge: 2012-04-19 | Disposition: A | Discharge status: Home / Self Care | Payer: BC Managed Care – PPO | Attending: Emergency Medicine | Admitting: Emergency Medicine

## 2012-04-19 ENCOUNTER — Encounter (HOSPITAL_BASED_OUTPATIENT_CLINIC_OR_DEPARTMENT_OTHER): Payer: Self-pay | Admitting: *Deleted

## 2012-04-19 DIAGNOSIS — Z79899 Other long term (current) drug therapy: Secondary | ICD-10-CM | POA: Insufficient documentation

## 2012-04-19 DIAGNOSIS — R109 Unspecified abdominal pain: Secondary | ICD-10-CM | POA: Insufficient documentation

## 2012-04-19 DIAGNOSIS — M545 Low back pain, unspecified: Secondary | ICD-10-CM | POA: Insufficient documentation

## 2012-04-19 DIAGNOSIS — F411 Generalized anxiety disorder: Secondary | ICD-10-CM | POA: Insufficient documentation

## 2012-04-19 DIAGNOSIS — Z7982 Long term (current) use of aspirin: Secondary | ICD-10-CM | POA: Insufficient documentation

## 2012-04-19 DIAGNOSIS — F17200 Nicotine dependence, unspecified, uncomplicated: Secondary | ICD-10-CM | POA: Insufficient documentation

## 2012-04-19 LAB — URINALYSIS, ROUTINE W REFLEX MICROSCOPIC
Bilirubin Urine: NEGATIVE
Glucose, UA: NEGATIVE mg/dL
Ketones, ur: NEGATIVE mg/dL
Protein, ur: NEGATIVE mg/dL
pH: 6.5 (ref 5.0–8.0)

## 2012-04-19 MED ORDER — ONDANSETRON HCL 4 MG/2ML IJ SOLN
4.0000 mg | Freq: Once | INTRAMUSCULAR | Status: AC
  Administered 2012-04-19: 4 mg via INTRAVENOUS
  Filled 2012-04-19: qty 2

## 2012-04-19 MED ORDER — HYDROMORPHONE HCL PF 1 MG/ML IJ SOLN
1.0000 mg | Freq: Once | INTRAMUSCULAR | Status: AC
  Administered 2012-04-19: 1 mg via INTRAVENOUS
  Filled 2012-04-19: qty 1

## 2012-04-19 MED ORDER — DIAZEPAM 5 MG PO TABS
10.0000 mg | ORAL_TABLET | Freq: Once | ORAL | Status: AC
  Administered 2012-04-19: 5 mg via ORAL
  Filled 2012-04-19: qty 1

## 2012-04-19 NOTE — ED Notes (Signed)
Patients family member has been in hallway twice.  Stating pt is in pain.  Advised Family Member that he is next to be seen.  Family Stated "he is going to rip the room apart if she dont come in."

## 2012-04-19 NOTE — ED Notes (Signed)
Specific instructions given for medication administration and f/u with primary MD on Monday. Spouse verbalized understanding

## 2012-04-19 NOTE — ED Notes (Signed)
Pt presents to ED today with continued back pain.  Pt has been seen here and at [facility] for same

## 2012-04-19 NOTE — ED Provider Notes (Signed)
History     CSN: [id number]  Arrival date & time 04/19/12  1032   First MD Initiated Contact with Patient 04/19/12 1119      Chief Complaint  Patient presents with  . Back Pain    (Consider location/radiation/quality/duration/timing/severity/associated sxs/prior treatment) HPI Pt presents with c/o lower back pain.  Pt states pain is in left lower back.  No known injury.  Has been taking prednisone, pain meds, muscle relaxers.  He states nothing has helped the pain.  No fever, no weakness of legs, no urinary retention or incontinence of bowel or bladder.  There are no other associated systemic symptoms, there are no other alleviating or modifying factors. States pain is worse in the morning after he has been lying in bed at night.    Past Medical History  Diagnosis Date  . Chest pain     a. 08/2011 Echo: EF 60-65%, nl wall motion;  b. 08/2011 Normal ETT:  walked 8:19 w/o ST/T changes.  . Tobacco abuse   . Fatigue   . Pre-syncope   . Anxiety     Past Surgical History  Procedure Date  . Finger surgery     L index     Family History  Problem Relation Age of Onset  . Heart disease Father     father had CABG in 108's  . Heart attack Father     14s  . Diabetes      F, GM  . Colon cancer Neg Hx   . Prostate cancer Neg Hx     History  Substance Use Topics  . Smoking status: Current Every Day Smoker -- 1.0 packs/day for 17 years    Types: Cigarettes  . Smokeless tobacco: Never Used  . Alcohol Use: Yes     Comment: rarely       Review of Systems ROS reviewed and all otherwise negative except for mentioned in HPI  Allergies  Review of patient's allergies indicates no known allergies.  Home Medications   Current Outpatient Rx  Name  Route  Sig  Dispense  Refill  . ASPIRIN 81 MG PO CHEW   Oral   Chew 1 tablet (81 mg total) by mouth daily.   30 tablet   0   . CYCLOBENZAPRINE HCL 10 MG PO TABS   Oral   Take 1 tablet (10 mg total) by mouth 2 (two) times daily as  needed for muscle spasms.   20 tablet   0   . OMEGA-3 FATTY ACIDS 1000 MG PO CAPS   Oral   Take 1 g by mouth daily.         Marland Kitchen HYDROCODONE-ACETAMINOPHEN 5-325 MG PO TABS   Oral   Take 2 tablets by mouth every 4 (four) hours as needed for pain.   20 tablet   0   . LORAZEPAM 0.5 MG PO TABS   Oral   Take 1 tablet (0.5 mg total) by mouth 2 (two) times daily as needed for anxiety.   30 tablet   0   . PREDNISONE 10 MG PO TABS      4 tablets x 2 days, 3 tabs x 2 days, 2 tabs x 2 days, 1 tab x 2 days   20 tablet   0     BP 115/80  Pulse 87  Temp 97.9 F (36.6 C)  Resp 20  SpO2 100% Vitals reviewed Physical Exam Physical Examination: General appearance - alert, uncomfortable appearing- writhing around in bed, throwing pillows,  and in no distress Mental status - alert, oriented to person, place, and time Mouth - mucous membranes moist, pharynx normal without lesions Chest - clear to auscultation, no wheezes, rales or rhonchi, symmetric air entry Heart - normal rate, regular rhythm, normal S1, S2, no murmurs, rubs, clicks or gallops Abdomen - soft, nontender, nondistended, no masses or organomegaly Back exam - no midline tenderness in lumbar spine, paraspinous tenderness in left lower back, no CVA tenderness Neuro- strength 5/5 in extremities x 4, sensation intact Extremities - peripheral pulses normal, no pedal edema, no clubbing or cyanosis Skin - normal coloration and turgor, no rashes ED Course  Procedures (including critical care time)   Labs Reviewed  URINALYSIS, ROUTINE W REFLEX MICROSCOPIC   Ct Abdomen Pelvis Wo Contrast  04/19/2012  *RADIOLOGY REPORT*  Clinical Data: Back pain and abdominal pain.  CT ABDOMEN AND PELVIS WITHOUT CONTRAST  Technique:  Multidetector CT imaging of the abdomen and pelvis was performed following the standard protocol without intravenous contrast.  Comparison: None  Findings: The lung bases are clear.  No pleural effusion or pulmonary  nodule.  The unenhanced appearance of the liver is normal.  No focal lesions or intrahepatic biliary dilatation.  The gallbladder is normal.  No common bile duct dilatation.  The pancreas is normal.  The spleen is normal.  The adrenal glands and kidneys are normal.  No renal or obstructing ureteral calculi and no bladder calculi.  The stomach, duodenum, small bowel and colon are grossly normal without contrast.  The appendix is normal.  The aorta is normal in caliber.  No atherosclerotic changes.  The major branch vessels are patent.  No mesenteric or retroperitoneal mass or adenopathy. There are small scattered lymph nodes.  The bladder, prostate gland and seminal vesicles are unremarkable. No pelvic mass, adenopathy or free pelvic fluid collections.  No inguinal mass or hernia.  The bony structures are unremarkable.  IMPRESSION: No acute abdominal/pelvic findings.  No renal or obstructing ureteral calculi.   Original Report Authenticated By: Rudie Meyer, M.D.    Dg Lumbar Spine Complete  04/17/2012  *RADIOLOGY REPORT*  Clinical Data: Low back pain  LUMBAR SPINE - COMPLETE 4+ VIEW  Comparison: None.  Findings: Five lumbar-type vertebral bodies show minimal scoliosis convex to the left in the thoracolumbar region into the right in the lower lumbar region.  There is mild disc space narrowing at L4- 5.  Other disc heights are normal.  There is mild facet degeneration at L4-5 and L5-S1.  No pars defect or slippage. Sacroiliac joints appear normal.  IMPRESSION: No acute finding.  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   Original Report Authenticated By: Paulina Fusi, M.D.      1. Low back pain       MDM  Pt presenting with continued lower back pain- he is finishing a steroid taper, is taking hydrocodone and muscle relaxers.  No signs or symptoms of cauda equina.  Pt rechecked and felt much improved after IV pain meds.  Urinalysis and CT abd/pelvis obtained due to concern  for possible renal stone due to patients presentation of writhing in bed- these were normal.  Suspect muscular spasm and lumbar films from recent ED visit showed degenerative arthritic changes in lumbar spine.  Pt strongly advised to f/u with his primary doctor if symptoms persist and may need further outpatient imaging.  Discharged with strict return precautions.  Pt agreeable with plan.        Johnny Bridge  Johny Shock, MD 04/19/12 1505

## 2012-04-20 ENCOUNTER — Telehealth: Payer: Self-pay | Admitting: Internal Medicine

## 2012-04-20 ENCOUNTER — Other Ambulatory Visit: Payer: BC Managed Care – PPO

## 2012-04-20 DIAGNOSIS — M549 Dorsalgia, unspecified: Secondary | ICD-10-CM

## 2012-04-20 MED ORDER — HYDROCODONE-ACETAMINOPHEN 5-325 MG PO TABS: 2.0000 | ORAL_TABLET | ORAL | Status: DC | PRN

## 2012-04-20 NOTE — Telephone Encounter (Signed)
Advise patient 1. Will schedule a lumbosacral MRI DX sever  back pain--- please arrange 2. Arrange for orthopedic surgical referral for this week 3. Continue with hydrocodone as prescribed by the ER doctor, call and additional 30 tablets, no refills.

## 2012-04-20 NOTE — Telephone Encounter (Signed)
requesting MRI  ASAP per jonni per [facility] he needs one-callback# [phone]

## 2012-04-20 NOTE — Telephone Encounter (Signed)
Discussed with pt, entered orders, & sent in rx.

## 2012-04-20 NOTE — Telephone Encounter (Signed)
Please advise 

## 2012-04-21 ENCOUNTER — Ambulatory Visit (HOSPITAL_COMMUNITY)
Admission: RE | Admit: 2012-04-21 | Discharge: 2012-04-21 | Disposition: A | Discharge status: Home / Self Care | Payer: BC Managed Care – PPO | Source: Ambulatory Visit | Attending: Internal Medicine | Admitting: Internal Medicine

## 2012-04-21 DIAGNOSIS — M549 Dorsalgia, unspecified: Secondary | ICD-10-CM | POA: Insufficient documentation

## 2012-04-21 NOTE — Telephone Encounter (Signed)
no

## 2012-04-21 NOTE — Telephone Encounter (Signed)
Can you schedule pt an office visit and call pt to advise. See previous notes.   MW  

## 2012-04-21 NOTE — Telephone Encounter (Signed)
Pt is scheduled for MRI. Does he still need OV?

## 2012-04-21 NOTE — Telephone Encounter (Signed)
Lmovm for pt to call office. °

## 2012-06-16 ENCOUNTER — Telehealth: Payer: Self-pay | Admitting: Internal Medicine

## 2012-06-16 DIAGNOSIS — M549 Dorsalgia, unspecified: Secondary | ICD-10-CM

## 2012-06-16 MED ORDER — CYCLOBENZAPRINE HCL 10 MG PO TABS: 10.0000 mg | ORAL_TABLET | Freq: Two times a day (BID) | ORAL | Status: DC | PRN

## 2012-06-16 MED ORDER — LORAZEPAM 0.5 MG PO TABS: 0.5000 mg | ORAL_TABLET | Freq: Two times a day (BID) | ORAL | Status: DC | PRN

## 2012-06-16 NOTE — Telephone Encounter (Signed)
Please advise 

## 2012-06-16 NOTE — Telephone Encounter (Signed)
Patient states he is still in pain and the ortho appts did not help him. He would like a referral to [facility]. Please call pt back at [phone] Pt uese [facility] and needs refills on Ativan Flexeril

## 2012-06-16 NOTE — Telephone Encounter (Signed)
RF done Please arrange a referral to neurosurgery ([facility]) but tell the patient that they probably won't be able to see him because the MRI did not show anything obviously surgical.

## 2012-06-16 NOTE — Telephone Encounter (Signed)
Left message on VM informing patient per Dr.Paz, rx's sent ([facility]), order for referral placed, patient to hear from our referral coordinatior or Neuro office shortly.

## 2012-06-30 ENCOUNTER — Encounter: Payer: Self-pay | Admitting: Family Medicine

## 2012-06-30 ENCOUNTER — Ambulatory Visit (INDEPENDENT_AMBULATORY_CARE_PROVIDER_SITE_OTHER): Payer: BC Managed Care – PPO | Admitting: Family Medicine

## 2012-06-30 VITALS — BP 120/80 | HR 100 | Temp 98.9°F | Wt 180.2 lb

## 2012-06-30 DIAGNOSIS — M5126 Other intervertebral disc displacement, lumbar region: Secondary | ICD-10-CM

## 2012-06-30 MED ORDER — HYDROCODONE-ACETAMINOPHEN 5-325 MG PO TABS: ORAL_TABLET | ORAL | Status: DC

## 2012-06-30 MED ORDER — CYCLOBENZAPRINE HCL 10 MG PO TABS: 10.0000 mg | ORAL_TABLET | Freq: Two times a day (BID) | ORAL | Status: DC | PRN

## 2012-06-30 NOTE — Patient Instructions (Addendum)
Back Pain, Adult Low back pain is very common. About 1 in 5 people have back pain.The cause of low back pain is rarely dangerous. The pain often gets better over time.About half of people with a sudden onset of back pain feel better in just 2 weeks. About 8 in 10 people feel better by 6 weeks.  CAUSES Some common causes of back pain include:  Strain of the muscles or ligaments supporting the spine.  Wear and tear (degeneration) of the spinal discs.  Arthritis.  Direct injury to the back. DIAGNOSIS Most of the time, the direct cause of low back pain is not known.However, back pain can be treated effectively even when the exact cause of the pain is unknown.Answering your caregiver's questions about your overall health and symptoms is one of the most accurate ways to make sure the cause of your pain is not dangerous. If your caregiver needs more information, he or she may order lab work or imaging tests (X-rays or MRIs).However, even if imaging tests show changes in your back, this usually does not require surgery. HOME CARE INSTRUCTIONS For many people, back pain returns.Since low back pain is rarely dangerous, it is often a condition that people can learn to manageon their own.   Remain active. It is stressful on the back to sit or stand in one place. Do not sit, drive, or stand in one place for more than 30 minutes at a time. Take short walks on level surfaces as soon as pain allows.Try to increase the length of time you walk each day.  Do not stay in bed.Resting more than 1 or 2 days can delay your recovery.  Do not avoid exercise or work.Your body is made to move.It is not dangerous to be active, even though your back may hurt.Your back will likely heal faster if you return to being active before your pain is gone.  Pay attention to your body when you bend and lift. Many people have less discomfortwhen lifting if they bend their knees, keep the load close to their bodies,and  avoid twisting. Often, the most comfortable positions are those that put less stress on your recovering back.  Find a comfortable position to sleep. Use a firm mattress and lie on your side with your knees slightly bent. If you lie on your back, put a pillow under your knees.  Only take over-the-counter or prescription medicines as directed by your caregiver. Over-the-counter medicines to reduce pain and inflammation are often the most helpful.Your caregiver may prescribe muscle relaxant drugs.These medicines help dull your pain so you can more quickly return to your normal activities and healthy exercise.  Put ice on the injured area.  Put ice in a plastic bag.  Place a towel between your skin and the bag.  Leave the ice on for 15 to 20 minutes, 3 to 4 times a day for the first 2 to 3 days. After that, ice and heat may be alternated to reduce pain and spasms.  Ask your caregiver about trying back exercises and gentle massage. This may be of some benefit.  Avoid feeling anxious or stressed.Stress increases muscle tension and can worsen back pain.It is important to recognize when you are anxious or stressed and learn ways to manage it.Exercise is a great option. SEEK MEDICAL CARE IF:  You have pain that is not relieved with rest or medicine.  You have pain that does not improve in 1 week.  You have new symptoms.  You are generally   not feeling well. SEEK IMMEDIATE MEDICAL CARE IF:   You have pain that radiates from your back into your legs.  You develop new bowel or bladder control problems.  You have unusual weakness or numbness in your arms or legs.  You develop nausea or vomiting.  You develop abdominal pain.  You feel faint. Document Released: 06/03/2005 Document Revised: 12/03/2011 Document Reviewed: 10/22/2010 Exit[employer] Patient Information 2013 [employer].  

## 2012-06-30 NOTE — Progress Notes (Signed)
  Subjective:    Christopher Ray is a 42 y.o. male who presents for follow up of low back problems. Current symptoms include: numbness in L leg  and pain in L hip (burning, numbing and sharp in character; 10/10 in severity). Symptoms have significantly worsened from the previous visit. Exacerbating factors identified by the patient are bending sideways, recumbency, sitting and standing. Pt has injection scheduled for end of   The following portions of the patient's history were reviewed and updated as appropriate: allergies, current medications, past family history, past medical history, past social history, past surgical history and problem list.    Objective:    BP 120/80  Pulse 100  Temp 98.9 F (37.2 C) (Oral)  Wt 180 lb 3.2 oz (81.738 kg)  SpO2 97% General appearance: alert, cooperative, appears stated age and severe distress Neurologic: Gait: Antalgic   pt unable to sit or stand for long secondary to pain MRI reviewed Assessment:    low back pain probably due to bulging disc L4-5    Plan:    Educational material distributed. Short (2-4 day) period of relative rest recommended until acute symptoms improve. Ice to affected area as needed for local pain relief. Heat to affected area as needed for local pain relief. Muscle relaxants per medication orders. pain meds refilled

## 2012-08-03 ENCOUNTER — Encounter: Payer: Self-pay | Admitting: Lab

## 2012-08-03 ENCOUNTER — Telehealth: Payer: Self-pay | Admitting: Internal Medicine

## 2012-08-03 NOTE — Telephone Encounter (Signed)
To MD and assistant for review.      KP

## 2012-08-03 NOTE — Telephone Encounter (Signed)
Patient Information:  Caller Name: Dnaiel  Phone: [phone]  Patient: Christopher Ray, Christopher Ray  Gender: Male  DOB: 06-03-1971  Age: 42 Years  PCP: Willow Ora  Office Follow Up:  Does the office need to follow up with this patient?: Yes  Instructions For The Office: Per triage, "see PCP today disposition".  Pt declines UC/ED.  Has appt tomorrow but would like to be seen today if possible.  RN Note:  Started taking Lorazepam 0.5mg  as needed in November 2013.  Last took medication on 08/01/12.  Has been feeling tired for the past month.  Thinks it may be from the medication.  C/o decreased energy, mostly at night.  States he has an appt for tomorrow.  Symptoms  Reason For Call & Symptoms: Generalized fatigue, decreased energy, decreased libido.  Reviewed Health History In EMR: Yes  Reviewed Medications In EMR: Yes  Reviewed Allergies In EMR: Yes  Reviewed Surgeries / Procedures: Yes  Date of Onset of Symptoms: 07/03/2012  Guideline(s) Used:  Weakness (Generalized) and Fatigue  Disposition Per Guideline:   See Today in Office  Reason For Disposition Reached:   Moderate weakness (i.e., interferes with work, school, normal activities) and persists > 3 days  Advice Given:  Call Back If:  Unable to stand or walk  Passes out  Breathing difficulty occurs  You become worse.

## 2012-08-03 NOTE — Telephone Encounter (Signed)
Unable to see the patient today. If severe symptoms needs to go to the ER otherwise schedule a visit for tomorrow

## 2012-08-04 ENCOUNTER — Encounter: Payer: Self-pay | Admitting: Internal Medicine

## 2012-08-04 ENCOUNTER — Ambulatory Visit (INDEPENDENT_AMBULATORY_CARE_PROVIDER_SITE_OTHER): Payer: BC Managed Care – PPO | Admitting: Internal Medicine

## 2012-08-04 VITALS — BP 148/84 | HR 97 | Temp 98.1°F | Wt 177.0 lb

## 2012-08-04 DIAGNOSIS — M549 Dorsalgia, unspecified: Secondary | ICD-10-CM | POA: Insufficient documentation

## 2012-08-04 DIAGNOSIS — H539 Unspecified visual disturbance: Secondary | ICD-10-CM | POA: Insufficient documentation

## 2012-08-04 NOTE — Progress Notes (Signed)
  Subjective:    Patient ID: Christopher Ray, male    DOB: 03-Jul-1970, 42 y.o.   MRN: [id number]  HPI He is seen urgently today because visual disturbances. Reports a two-month history of left eye blurred vision, patient has a hard time describing it any better, symptoms are on and off, last 2-6 hours. Dizziness ? Also, had back pain, status post a local injection, doing better.  Past Medical History  Diagnosis Date  . Chest pain     a. 08/2011 Echo: EF 60-65%, nl wall motion;  b. 08/2011 Normal ETT:  walked 8:19 w/o ST/T changes.  . Tobacco abuse   . Fatigue   . Pre-syncope   . Anxiety    Past Surgical History  Procedure Laterality Date  . Finger surgery      L index    History   Social History  . Marital Status: Married    Spouse Name: N/A    Number of Children: 2  . Years of Education: N/A   Occupational History  . 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 Smoking status: Current Every Day Smoker -- 1.00 packs/day for 17 years    Types: Cigarettes  . Smokeless tobacco: Never Used  . Alcohol Use: Yes     Comment: rarely   . Drug Use: No  . Sexually Active: Not on file   Other Topics Concern  . Not on file   Social History Narrative   Divorced, 2 children--- works at [employer]: better compared to 3 years ago, has cut down on fried food---exercise: active at work, no routine exercise     Review of Systems Denies any motor deficits, diplopia, difficulty with his speech. No headaches, nausea, vomiting. Occasional palpitations but no chest pain. Anxiety well controlled with lorazepam.    Objective:   Physical Exam General -- alert, well-developed  Neck -- normal carotid pulse , no bruit  HEENT-- undilated funduscopy grossly normal, EOMI, PERLA, ant chambers wnl Lungs -- normal respiratory effort, no intercostal retractions, no accessory muscle use, and normal breath sounds.   Heart-- normal rate, regular rhythm, no murmur, and  no gallop.   Extremities-- no pretibial edema bilaterally  Neurologic-- alert & oriented X3, speech clear, motor and DTRs symmetric, face symmetric. Gait [employer]. Psych-- Cognition and judgment appear intact. Alert and cooperative with normal attention span and concentration.  not anxious appearing and not depressed appearing.       Assessment & Plan:  Labs were physical exam were never done. Will draw them this week. See instructions.

## 2012-08-04 NOTE — Patient Instructions (Addendum)
Come back fasting: FLP, AST, ALT---- dx v70 Hemoglobin A1c --- hyperglycemia  Free and total testosterone-- decreased libido --- Come back  for a checkup in 2 months

## 2012-08-04 NOTE — Assessment & Plan Note (Signed)
2 months history of visual disturbances, neurological exam is normal. undilated eye exam normal as well. Plan: Check a carotid ultrasound Ophthalmology referral. If symptoms persist or increase patient to let me know.

## 2012-08-04 NOTE — Telephone Encounter (Signed)
Pt has appt scheduled for today. ?

## 2012-08-04 NOTE — Assessment & Plan Note (Signed)
Was recently seen at the ER with back pain, status post a local injection, doing better.

## 2012-08-04 NOTE — Telephone Encounter (Signed)
Pt has appt today

## 2012-08-05 NOTE — Addendum Note (Signed)
Addended by: Edwena Felty T on: 08/05/2012 10:15 AM   Modules accepted: Orders

## 2012-08-07 ENCOUNTER — Other Ambulatory Visit (INDEPENDENT_AMBULATORY_CARE_PROVIDER_SITE_OTHER): Payer: BC Managed Care – PPO

## 2012-08-07 DIAGNOSIS — Z0000 Encounter for general adult medical examination without abnormal findings: Secondary | ICD-10-CM

## 2012-08-07 LAB — HEPATIC FUNCTION PANEL
ALT: 30 U/L (ref 0–53)
Alkaline Phosphatase: 73 U/L (ref 39–117)
Bilirubin, Direct: 0.1 mg/dL (ref 0.0–0.3)
Total Bilirubin: 0.7 mg/dL (ref 0.3–1.2)
Total Protein: 7.5 g/dL (ref 6.0–8.3)

## 2012-08-07 LAB — LIPID PANEL
Cholesterol: 170 mg/dL (ref 0–200)
VLDL: 17.4 mg/dL (ref 0.0–40.0)

## 2012-08-07 LAB — HEMOGLOBIN A1C: Hgb A1c MFr Bld: 5.6 % (ref 4.6–6.5)

## 2012-08-10 LAB — TESTOSTERONE, FREE, TOTAL, SHBG
Sex Hormone Binding: 50 nmol/L (ref 13–71)
Testosterone, Free: 76.5 pg/mL (ref 47.0–244.0)

## 2012-08-12 ENCOUNTER — Encounter: Payer: Self-pay | Admitting: *Deleted

## 2012-10-05 ENCOUNTER — Encounter: Payer: Self-pay | Admitting: Lab

## 2012-10-06 ENCOUNTER — Ambulatory Visit: Payer: BC Managed Care – PPO | Admitting: Internal Medicine

## 2012-10-06 DIAGNOSIS — Z0289 Encounter for other administrative examinations: Secondary | ICD-10-CM

## 2012-11-20 ENCOUNTER — Ambulatory Visit: Payer: BC Managed Care – PPO | Admitting: Internal Medicine

## 2012-11-25 ENCOUNTER — Ambulatory Visit (INDEPENDENT_AMBULATORY_CARE_PROVIDER_SITE_OTHER): Payer: BC Managed Care – PPO | Admitting: Internal Medicine

## 2012-11-25 VITALS — BP 122/84 | HR 111 | Temp 98.1°F | Wt 173.0 lb

## 2012-11-25 DIAGNOSIS — M549 Dorsalgia, unspecified: Secondary | ICD-10-CM

## 2012-11-25 MED ORDER — HYDROCODONE-ACETAMINOPHEN 10-325 MG PO TABS: 1.0000 | ORAL_TABLET | Freq: Three times a day (TID) | ORAL | Status: DC | PRN

## 2012-11-25 MED ORDER — CELECOXIB 200 MG PO CAPS: 200.0000 mg | ORAL_CAPSULE | Freq: Every day | ORAL | Status: DC | PRN

## 2012-11-25 NOTE — Patient Instructions (Addendum)
Stop monitoring, take Celebrex 200 mg one tablet daily with breakfast. Hydrocodone 10 mg, you can take it  up to 3 times a day but may cause drowsiness, you may like to use it only at bedtime

## 2012-11-25 NOTE — Assessment & Plan Note (Signed)
Back pain since November 2013 MRI showed: 1. Small central subligamentous disc protrusion at L4-5 slightly  touching the ventral aspect of the thecal sac but without focal  neural impingement.  2. Tiny central bulge of the L3-4 disc without neural impingement.  Saw Dr. Mikal Plane, was prescribed physical therapy, had 2 epidural shots, the first helped for 3 weeks, the 2nd did not help. Pain is ongoing, worse at night. Is taking "lots of Motrin every day" and  hydrocodone at night only because make him drowsy. Plan: refer to a different practice and get another opinion Change Motrin to Celebrex Increase hydrocodone from 5 mg to 10 mg See instructions Check liver and kidney functions d/t  chronic use of meds

## 2012-11-25 NOTE — Progress Notes (Signed)
  Subjective:    Patient ID: Christopher Ray, male    DOB: 03-Aug-1970, 42 y.o.   MRN: [id number]  HPI Here due to back pain. Back pain started in November 2013, he is getting no relief. Needs some direction.  Past Medical History  Diagnosis Date  . Chest pain     a. 08/2011 Echo: EF 60-65%, nl wall motion;  b. 08/2011 Normal ETT:  walked 8:19 w/o ST/T changes.  . Tobacco abuse   . Fatigue   . Pre-syncope   . Anxiety    Past Surgical History  Procedure Laterality Date  . Finger surgery      L index      Review of Systems No fever or chills. No bladder or bowel incontinence Some urinary frequency at times.     Objective:   Physical Exam  General -- alert, well-developed, Some antalgic posture.    Neurologic-- alert & oriented X3 and strength normal in all extremities. Psych-- Cognition and judgment appear intact. Alert and cooperative with normal attention span and concentration.  some anxiety, not depressed appearing.       Assessment & Plan:

## 2012-11-26 LAB — COMPREHENSIVE METABOLIC PANEL
BUN: 10 mg/dL (ref 6–23)
CO2: 28 mEq/L (ref 19–32)
Calcium: 11.3 mg/dL — ABNORMAL HIGH (ref 8.4–10.5)
Chloride: 102 mEq/L (ref 96–112)
Creatinine, Ser: 0.9 mg/dL (ref 0.4–1.5)
GFR: 94 mL/min (ref >60.00)
Glucose, Bld: 95 mg/dL (ref 70–99)

## 2012-12-04 ENCOUNTER — Telehealth: Payer: Self-pay | Admitting: Internal Medicine

## 2012-12-04 NOTE — Telephone Encounter (Signed)
Patient states we should have received paperwork for his celebrex. He is checking on the status.

## 2012-12-06 ENCOUNTER — Encounter: Payer: Self-pay | Admitting: Internal Medicine

## 2012-12-07 NOTE — Telephone Encounter (Addendum)
Called Pharmacy and obtain PA info. [phone]. PA formed received will complete and fax back.

## 2012-12-08 ENCOUNTER — Telehealth: Payer: Self-pay | Admitting: Internal Medicine

## 2012-12-08 NOTE — Telephone Encounter (Signed)
Please advise 

## 2012-12-08 NOTE — Telephone Encounter (Signed)
PA completed and faxed back 

## 2012-12-08 NOTE — Telephone Encounter (Signed)
Patient is calling to discuss the Celebrex Rx he is taking. States that he feels "out of it" a lot of times and tends to forget where is going lately while driving. Wants to know if this is a symptom of the medication and if he should continue taking.

## 2012-12-08 NOTE — Telephone Encounter (Signed)
Discussed with pt

## 2012-12-08 NOTE — Telephone Encounter (Signed)
I doubt Celebrex is causing those symptoms rather could be hydrocodone, it was increased from 5 mg to 10 mg. Recommend to go back to hydrocodone 5 mg (take 1/2 tab) If symptoms persist, let me know

## 2012-12-14 NOTE — Telephone Encounter (Signed)
Prior Auth Approved 12-08-12-06-16-2038, approval letter scan to chart, pharmacy faxed

## 2012-12-21 ENCOUNTER — Telehealth: Payer: Self-pay | Admitting: Internal Medicine

## 2012-12-21 NOTE — Telephone Encounter (Signed)
I don't recommend more than Celebrex 200 mg daily. He could try hydrocodone or Tylenol 500 mg 2 tablets every 8 hours as needed once Celebrex "weans off" . Also needs to see the back specialist and get some relief.

## 2012-12-21 NOTE — Telephone Encounter (Signed)
Attempted to contact pt, unable to leave message.

## 2012-12-21 NOTE — Telephone Encounter (Signed)
Patient is calling with concerns about his Celebrex Rx. States that the medication is not helping him for the entire day and his symptoms usually return by 2pm after taking it in the morning. Wants to know if his dosage should be increased. Please advise.

## 2012-12-21 NOTE — Telephone Encounter (Signed)
Please advise 

## 2012-12-22 NOTE — Telephone Encounter (Signed)
lmovm to return call to office.  

## 2012-12-23 NOTE — Telephone Encounter (Signed)
Spoke with patient, requested records are at front desk to pick up prior to his appointment per his request.

## 2012-12-23 NOTE — Telephone Encounter (Signed)
Shandell/Patient Phone: [phone]. Patient called back regarding message left by [facility].  Per Epic note 12/21/12 from Dr Drue Novel, informed Dr Drue Novel does not recommend more than Celebrex 200 mg daily.  He could try hydrocodone or Tylenol 500 mg 2 tablets every 8 hours as needed once Celebrex "weans off". Also needs to see the back specialist and get some relief.   Verbalized understanding of MD message.  Reviewed common Celebrex side effects per [email or web address]  per caller request.  Suggested taking Celebrex with food to buffer stomach.  Has appointment to see [facility] 12/31/12.  Asking how to get office records, including CT scan, sent to the orthopedic office?  Do they have access to EMR/Epic?  Please call back to advise.

## 2012-12-23 NOTE — Telephone Encounter (Signed)
Noted pt. Has appointment with [facility] on 12/31/12.

## 2012-12-29 ENCOUNTER — Telehealth: Payer: Self-pay | Admitting: Internal Medicine

## 2012-12-29 NOTE — Telephone Encounter (Signed)
Patient is calling to be advised on how he needs to take his Hydrocodone medication. Says that he was told to only take half a tablet on his last prescription because of the strength and wants to know how often he needs to take it everyday. Please advise.

## 2012-12-29 NOTE — Telephone Encounter (Signed)
Hydrocodone-APAP 10/325 He can take up to one tablet 3 times a day. If  a new prescription is needed, call  90, no refills. If this becomes a long-term medication, he will need to see Pain management

## 2012-12-29 NOTE — Telephone Encounter (Signed)
Last rx refill states to take 1 tablet every 8 hours as needed. Please advise.

## 2012-12-30 MED ORDER — HYDROCODONE-ACETAMINOPHEN 10-325 MG PO TABS: ORAL_TABLET | ORAL | Status: DC

## 2012-12-30 NOTE — Telephone Encounter (Signed)
Dr. Drue Novel, please advise. Spoke with patient, he provided clarification on his concern. He states he is currently taking 2 OTC Ibuprofen every 5 hours and wanted to know if it would be ok to take 1/2 tablet of the Vicodin not a whole tablet (because it makes him too sleepy) on top of the Ibuprofen. Pt. States he would like refill on Hydrocodone, will refill per prior orders.

## 2012-12-30 NOTE — Telephone Encounter (Signed)
Discussed with patient, verbalized understanding. Rx refill done per orders. rx faxed to [facility] per patient request.

## 2012-12-30 NOTE — Telephone Encounter (Signed)
Okay to take ibuprofen as long as he's not having stomach pain or nausea. Okay to take Vicodin 10/325 half tablet 3 times a day as needed, ok 90, no refills

## 2013-02-02 ENCOUNTER — Telehealth: Payer: Self-pay | Admitting: Internal Medicine

## 2013-02-02 NOTE — Telephone Encounter (Signed)
Due for labs. Please schedule a BMP, DX hypercalcemia

## 2013-02-03 NOTE — Telephone Encounter (Signed)
Patient scheduled for Friday, 02/05/13.

## 2013-02-03 NOTE — Telephone Encounter (Signed)
lmovm to return call  °

## 2013-02-03 NOTE — Telephone Encounter (Signed)
Orders placed.

## 2013-02-05 ENCOUNTER — Other Ambulatory Visit (INDEPENDENT_AMBULATORY_CARE_PROVIDER_SITE_OTHER): Payer: BC Managed Care – PPO

## 2013-02-05 LAB — BASIC METABOLIC PANEL
Chloride: 104 mEq/L (ref 96–112)
Creatinine, Ser: 1 mg/dL (ref 0.4–1.5)
Potassium: 3.3 mEq/L — ABNORMAL LOW (ref 3.5–5.1)
Sodium: 139 mEq/L (ref 135–145)

## 2013-02-08 ENCOUNTER — Encounter: Payer: Self-pay | Admitting: General Practice

## 2013-02-09 ENCOUNTER — Telehealth: Payer: Self-pay | Admitting: *Deleted

## 2013-02-09 ENCOUNTER — Encounter: Payer: Self-pay | Admitting: *Deleted

## 2013-02-09 NOTE — Telephone Encounter (Signed)
Message copied by Eustace Quail on Tue Feb 09, 2013  1:14 PM ------      Message from: Willow Ora E      Created: Sun Feb 07, 2013  4:13 PM       Advise patient:      Calcium is back to normal.      Potassium is slightly low, mail him a high potassium diet. Will recheck his K+ periodically ------

## 2013-02-09 NOTE — Telephone Encounter (Signed)
Please advise-will recheck his K+ periodically. When exactly would you want him to recheck his labs?

## 2013-02-09 NOTE — Telephone Encounter (Signed)
Message copied by Eustace Quail on Tue Feb 09, 2013  3:37 PM ------      Message from: Willow Ora E      Created: Sun Feb 07, 2013  4:13 PM       Advise patient:      Calcium is back to normal.      Potassium is slightly low, mail him a high potassium diet. Will recheck his K+ periodically ------

## 2013-02-09 NOTE — Telephone Encounter (Signed)
Pt notified via telephone High potassium diet mailed.  

## 2013-02-09 NOTE — Telephone Encounter (Signed)
Pt notified via telephone High potassium diet mailed.

## 2013-02-09 NOTE — Telephone Encounter (Signed)
advise pt yearly

## 2013-02-09 NOTE — Telephone Encounter (Signed)
Message copied by Eustace Quail on Tue Feb 09, 2013  1:08 PM ------      Message from: Willow Ora E      Created: Sun Feb 07, 2013  4:13 PM       Advise patient:      Calcium is back to normal.      Potassium is slightly low, mail him a high potassium diet. Will recheck his K+ periodically ------

## 2013-02-09 NOTE — Telephone Encounter (Signed)
Message copied by Eustace Quail on Tue Feb 09, 2013  3:35 PM ------      Message from: Willow Ora E      Created: Sun Feb 07, 2013  4:13 PM       Advise patient:      Calcium is back to normal.      Potassium is slightly low, mail him a high potassium diet. Will recheck his K+ periodically ------

## 2013-02-17 ENCOUNTER — Telehealth: Payer: Self-pay | Admitting: Internal Medicine

## 2013-02-17 IMAGING — CR DG CHEST 2V
2 series · 2 of 2 positions shown · non-contrast
Comparison: 07/29/2011

CLINICAL DATA: Chest discomfort

CHEST - 2 VIEW

[w chest pa]
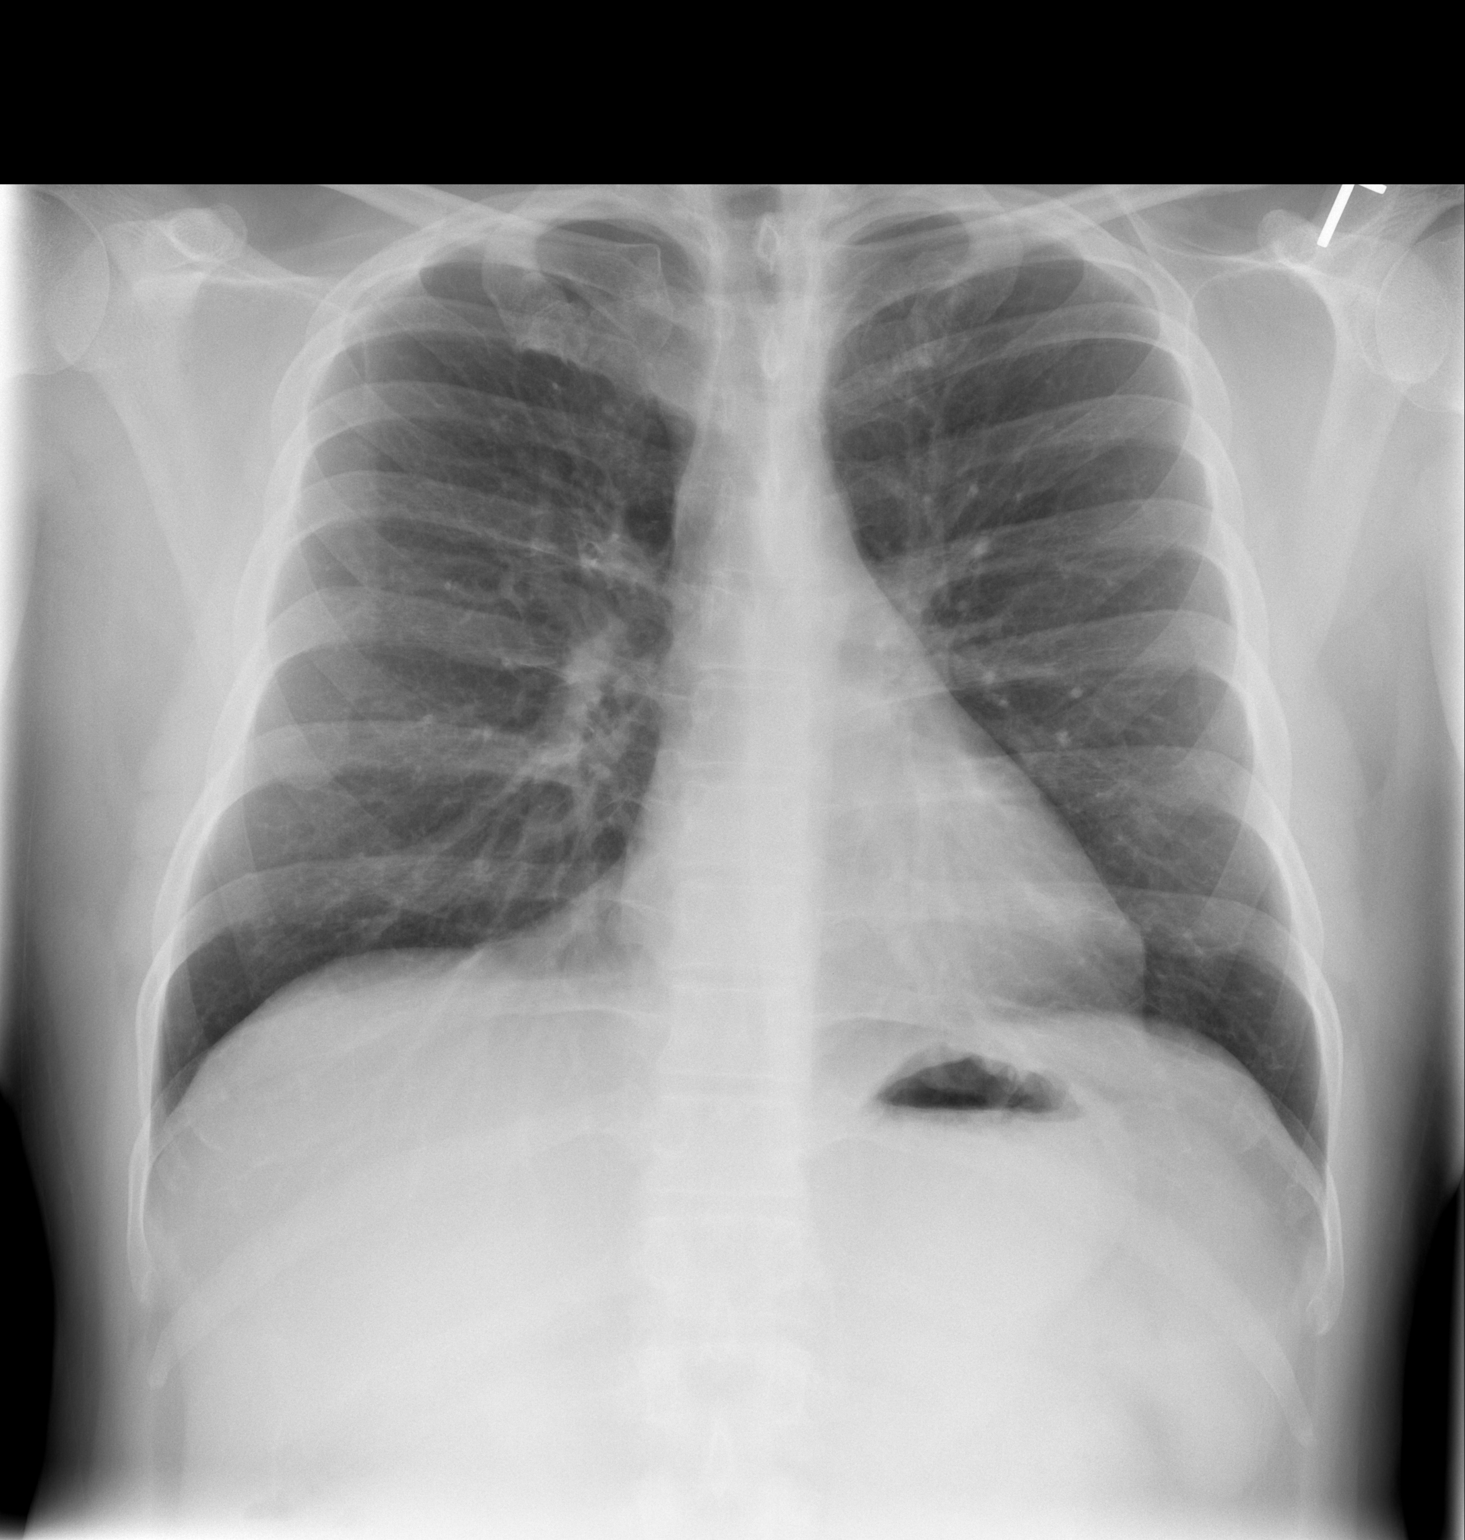

[w chest lat]
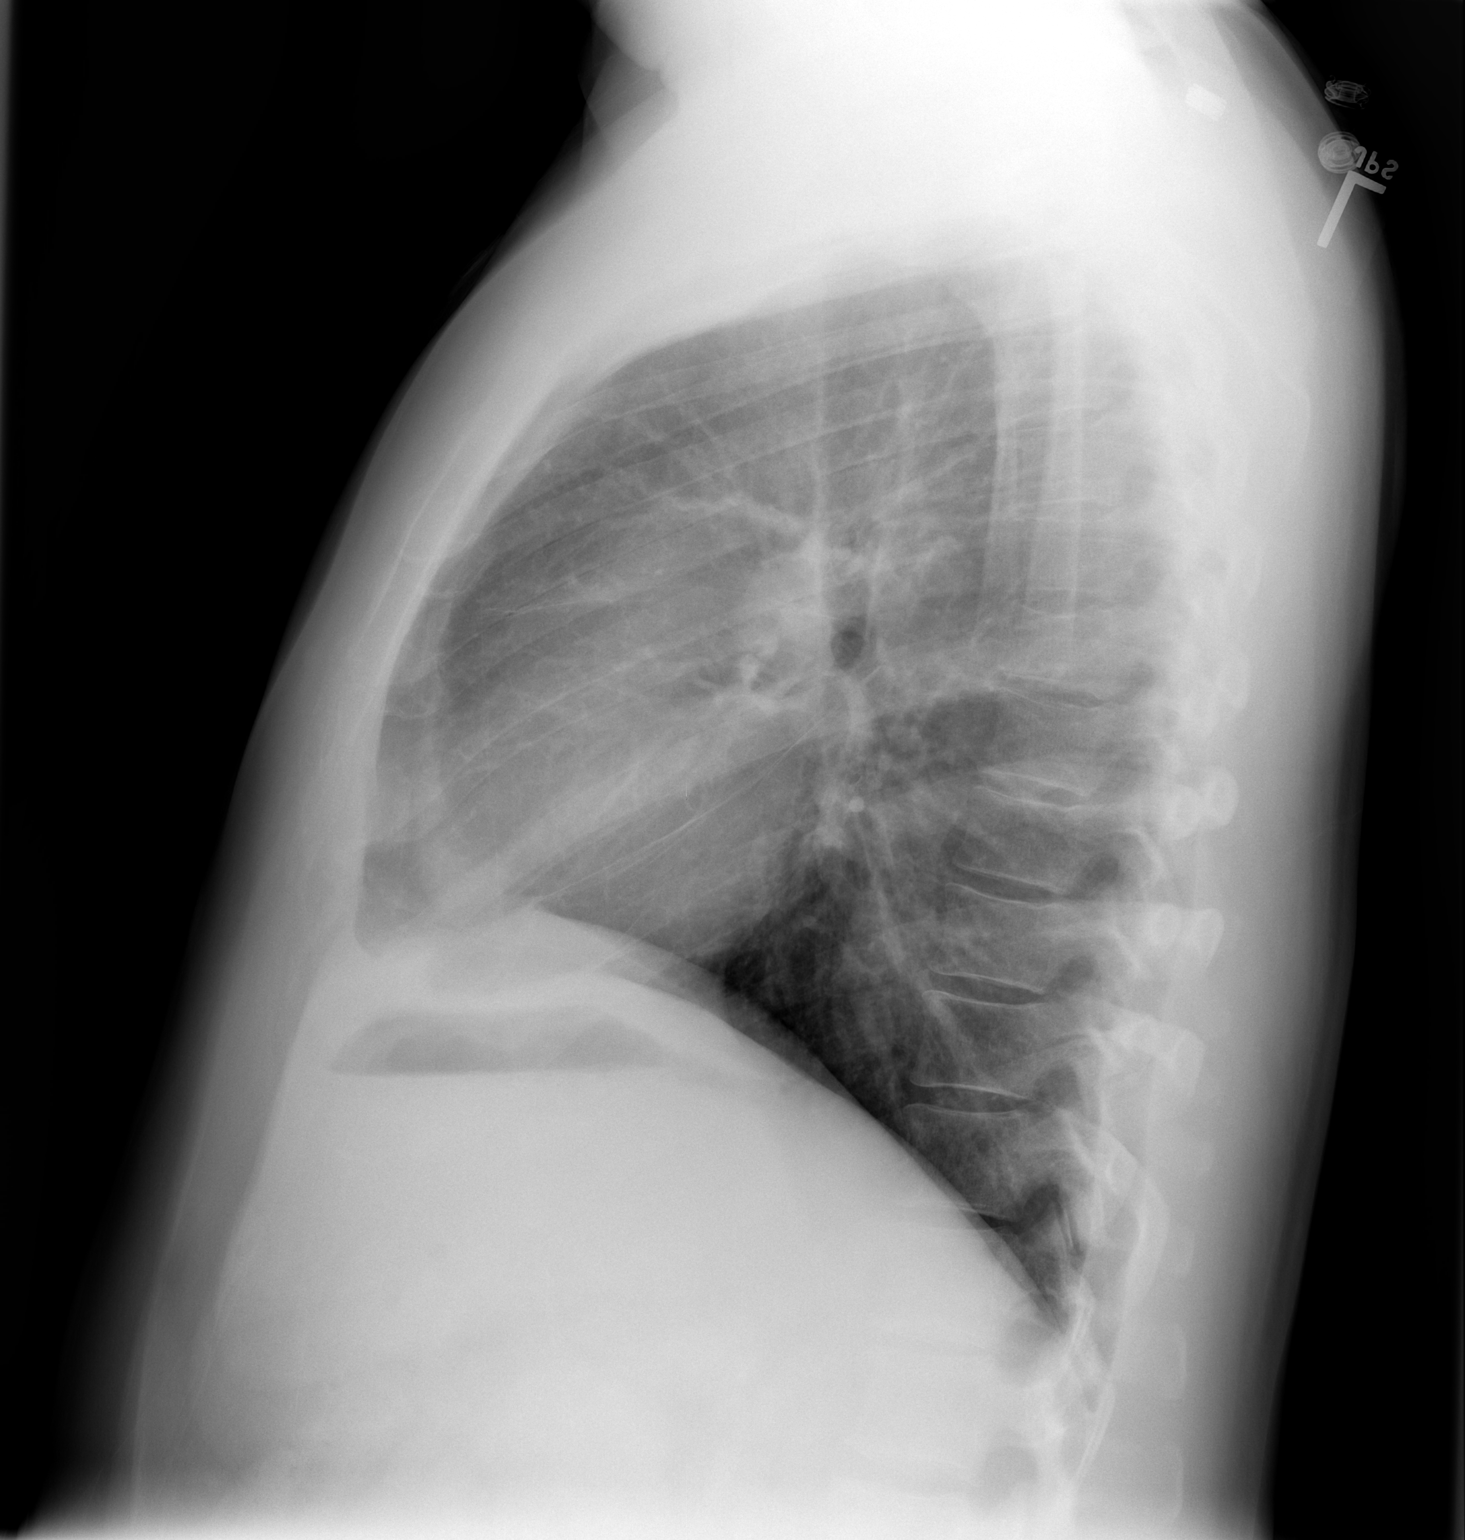

[2 of 2 positions shown; findings below may reference images not displayed]

FINDINGS: The lungs are clear without focal consolidation, edema,
effusion or pneumothorax.  Cardiopericardial silhouette is within
normal limits for size.  Imaged bony structures of the thorax are
intact.
IMPRESSION: No acute cardiopulmonary process.  Stable exam.

## 2013-02-17 NOTE — Telephone Encounter (Signed)
Lorazepam is not an active medication -- needs  office visit to discuss

## 2013-02-17 NOTE — Telephone Encounter (Signed)
Spoke with patient made aware that an OV would be required for a rf of lorazepam. Patient to call back to make appt at a later time.

## 2013-02-17 NOTE — Telephone Encounter (Signed)
rx refill- Lorazepam  Last OV- 11/25/12 Last refill- 01/22/12 by Grant Fontana PA-C  Current meds- celebrex & Norco.  Please advise. DJR

## 2013-02-17 NOTE — Telephone Encounter (Signed)
Patient is calling to request a new prescription for Lorazepam to be sent to [facility]. He no longer wants his prescriptions sent to [facility].    Patient would also like to be advised if he can take a particular [facility] brand OTC cold medicine for nasal and sinus congestion. He wants to know if it is okay with the current medications he is taking. He will read the label aloud when called.

## 2013-02-18 ENCOUNTER — Ambulatory Visit (INDEPENDENT_AMBULATORY_CARE_PROVIDER_SITE_OTHER): Payer: BC Managed Care – PPO | Admitting: Internal Medicine

## 2013-02-18 ENCOUNTER — Encounter: Payer: Self-pay | Admitting: Internal Medicine

## 2013-02-18 VITALS — BP 145/87 | HR 105 | Temp 98.4°F | Wt 182.0 lb

## 2013-02-18 DIAGNOSIS — J019 Acute sinusitis, unspecified: Secondary | ICD-10-CM | POA: Insufficient documentation

## 2013-02-18 DIAGNOSIS — F411 Generalized anxiety disorder: Secondary | ICD-10-CM

## 2013-02-18 DIAGNOSIS — F419 Anxiety disorder, unspecified: Secondary | ICD-10-CM

## 2013-02-18 MED ORDER — AMOXICILLIN 500 MG PO CAPS: 1000.0000 mg | ORAL_CAPSULE | Freq: Two times a day (BID) | ORAL | Status: DC

## 2013-02-18 MED ORDER — AZELASTINE HCL 0.1 % NA SOLN: 2.0000 | Freq: Every day | NASAL | Status: DC

## 2013-02-18 MED ORDER — LORAZEPAM 0.5 MG PO TABS: 0.5000 mg | ORAL_TABLET | Freq: Every evening | ORAL | Status: DC | PRN

## 2013-02-18 NOTE — Patient Instructions (Addendum)
Go to the lab for a UDS Rest, fluids , tylenol For congestion and if you develop cough: take Mucinex DM twice a day as needed   astelin nasal spray at bedtime  until you feel better Take the antibiotic as prescribed  (Amoxicillin) Call if no better in few days Call anytime if the symptoms are severe

## 2013-02-18 NOTE — Assessment & Plan Note (Signed)
History of anxiety, symptoms are episodic and mostly at night, responds well to lorazepam. Plan: Lorazepam , Recommend not to use in the daytime Doctor, general practice) Contract and UDS today

## 2013-02-18 NOTE — Progress Notes (Signed)
  Subjective:    Patient ID: Christopher Ray, male    DOB: 1970/10/26, 42 y.o.   MRN: [id number]  HPI Acute visit Sudden onset of upper respiratory symptoms yesterday morning: Frontal headache, nasal congestion, green nasal discharge. He also is sneezing some and feels fatigued. Has a history of anxiety, symptoms are mostly at night, he starts to " think too much" and is unable to sleep, lorazepam helps significantly and he requests a refill. Has been taking pseudoephedrine OTC.  Past Medical History  Diagnosis Date  . Chest pain     a. 08/2011 Echo: EF 60-65%, nl wall motion;  b. 08/2011 Normal ETT:  walked 8:19 w/o ST/T changes.  . Tobacco abuse   . Fatigue   . Pre-syncope   . Anxiety    Past Surgical History  Procedure Laterality Date  . Finger surgery      L index    History  Substance Use Topics  . Smoking status: Current Every Day Smoker -- 1.00 packs/day for 17 years    Types: Cigarettes  . Smokeless tobacco: Never Used  . Alcohol Use: No     Comment: rarely     Review of Systems Subjective fever yesterday, no chills. No cough. Mild chest tightness No myalgias per se but  the left arm is a little sooner, he did some mechanical work during the weekend . No recent airplane trip or prolonged car trip      Objective:   Physical Exam  BP 145/87  Pulse 105  Temp(Src) 98.4 F (36.9 C)  Wt 182 lb (82.555 kg)  BMI 26.5 kg/m2  SpO2 99% General -- alert, well-developed, NAD.  HEENT-- Not pale. TMs normal, throat symmetric, no redness or discharge. Face symmetric, sinuses not tender to palpation. Nose quite congested, nasal voice. Lungs -- normal respiratory effort, no intercostal retractions, no accessory muscle use, and normal breath sounds.  Heart-- slt tachycardic, regular rhythm, no murmur.   Extremities-- no pretibial edema bilaterally  Neurologic-- alert & oriented X3. Speech, gait normal.    Psych-- Cognition and judgment appear intact. Alert and cooperative  with normal attention span and concentration. not anxious appearing and not depressed appearing.      Assessment & Plan:

## 2013-02-18 NOTE — Assessment & Plan Note (Signed)
Symptoms consistent with acute sinusitis, he is a slightly tachycardic but not toxic, has been taking pseudoephedrine. Plan: Antibiotics, Mucinex DM, see instructions

## 2014-03-14 ENCOUNTER — Ambulatory Visit (INDEPENDENT_AMBULATORY_CARE_PROVIDER_SITE_OTHER): Payer: BC Managed Care – PPO | Admitting: Internal Medicine

## 2014-03-14 ENCOUNTER — Encounter: Payer: Self-pay | Admitting: Internal Medicine

## 2014-03-14 VITALS — BP 124/64 | HR 107 | Temp 97.8°F | Wt 192.1 lb

## 2014-03-14 DIAGNOSIS — J069 Acute upper respiratory infection, unspecified: Secondary | ICD-10-CM

## 2014-03-14 MED ORDER — AMOXICILLIN 500 MG PO CAPS: 1000.0000 mg | ORAL_CAPSULE | Freq: Two times a day (BID) | ORAL | Status: DC

## 2014-03-14 NOTE — Patient Instructions (Signed)
Rest, fluids , tylenol For cough, take Mucinex DM twice a day as needed  For congestion use OTC Nasocort: 2 nasal sprays on each side of the nose daily until you feel better  Take the antibiotic as prescribed  (Amoxicillin) if no better in 3-4 days  Call if not gradually better over the next 10days Call anytime if the symptoms are severe

## 2014-03-14 NOTE — Progress Notes (Signed)
   Subjective:    Patient ID: Christopher Ray, male    DOB: May 09, 1971, 43 y.o.   MRN: [id number]  DOS:  03/14/2014 Type of visit - description : acute Interval history: Symptoms started 3 days ago, general malaise, gradually developed chest congestion and he started with cough with abundant brown mucus. Yesterday, symptoms "went to the head", cough has decrease, very little sputum. He has facial pressure bilaterally and also nose is stopped up. Taking Mucinex DM and Tylenol PM as needed   ROS Mild subjective fever, no chills No SOB- chest pain. No nausea vomiting, had diarrhea today without   blood in the stools. Mild aches  Past Medical History  Diagnosis Date  . Chest pain     a. 08/2011 Echo: EF 60-65%, nl wall motion;  b. 08/2011 Normal ETT:  walked 8:19 w/o ST/T changes.  . Tobacco abuse   . Fatigue   . Pre-syncope   . Anxiety     Past Surgical History  Procedure Laterality Date  . Finger surgery      L index     History   Social History  . Marital Status: Married    Spouse Name: N/A    Number of Children: 2  . Years of Education: N/A   Occupational History  . Heavy machine operator [employer]   Social History Main Topics  . Smoking status: Current Every Day Smoker -- 1.00 packs/day for 17 years    Types: Cigarettes  . Smokeless tobacco: Never Used  . Alcohol Use: No     Comment: rarely   . Drug Use: No  . Sexual Activity: Not on file   Other Topics Concern  . Not on file   Social History Narrative   Divorced, 2 children         Medication List       This list is accurate as of: 03/14/14  5:54 PM.  Always use your most recent med list.               amoxicillin 500 MG capsule  Commonly known as:  AMOXIL  Take 2 capsules (1,000 mg total) by mouth 2 (two) times daily.     LORazepam 0.5 MG tablet  Commonly known as:  ATIVAN  Take 1 tablet (0.5 mg total) by mouth at bedtime as needed for anxiety.           Objective:   Physical  Exam BP 124/64  Pulse 107  Temp(Src) 97.8 F (36.6 C) (Oral)  Wt 192 lb 2 oz (87.147 kg)  SpO2 97%  General -- alert, well-developed, NAD.   HEENT-- Not pale.  R Ear-- normal L ear-- normal Throat symmetric, no redness or discharge.  Face symmetric, sinuses not tender to palpation. Nose quite congested. Lungs -- normal respiratory effort, no intercostal retractions, no accessory muscle use, and normal breath sounds.  Heart-- normal rate, regular rhythm, no murmur.  Extremities-- no pretibial edema bilaterally  Neurologic--  alert & oriented X3. Speech normal, gait appropriate for age, strength symmetric and appropriate for age.  Psych-- Cognition and judgment appear intact. Cooperative with normal attention span and concentration. No anxious or depressed appearing.     Assessment & Plan:    URI, early sinusitis Symptoms consistent with URI or early sinusitis. See instructions. Also he is encouraged to come back for a routine check

## 2014-03-14 NOTE — Progress Notes (Signed)
Pre visit review using our clinic review tool, if applicable. No additional management support is needed unless otherwise documented below in the visit note. 

## 2014-06-06 ENCOUNTER — Encounter: Payer: Self-pay | Admitting: Physician Assistant

## 2015-05-31 ENCOUNTER — Telehealth: Payer: Self-pay

## 2015-05-31 NOTE — Telephone Encounter (Signed)
Christopher Ray, this patient is scheduled for 12.21 @ 3 pm , he had a sleep study done at [facility] in 2005 or 2006.  Are you able to go into his chart and get this info?

## 2015-05-31 NOTE — Telephone Encounter (Signed)
I looked in [facility] but no records there.

## 2015-06-07 ENCOUNTER — Ambulatory Visit (INDEPENDENT_AMBULATORY_CARE_PROVIDER_SITE_OTHER): Payer: BLUE CROSS/BLUE SHIELD | Admitting: Neurology

## 2015-06-07 ENCOUNTER — Encounter: Payer: Self-pay | Admitting: Neurology

## 2015-06-07 VITALS — BP 120/84 | HR 89 | Resp 20 | Ht 67.0 in | Wt 193.5 lb

## 2015-06-07 DIAGNOSIS — R0681 Apnea, not elsewhere classified: Secondary | ICD-10-CM

## 2015-06-07 DIAGNOSIS — Z720 Tobacco use: Secondary | ICD-10-CM

## 2015-06-07 DIAGNOSIS — G4719 Other hypersomnia: Secondary | ICD-10-CM

## 2015-06-07 DIAGNOSIS — E663 Overweight: Secondary | ICD-10-CM | POA: Diagnosis not present

## 2015-06-07 DIAGNOSIS — R0683 Snoring: Secondary | ICD-10-CM

## 2015-06-07 DIAGNOSIS — F17200 Nicotine dependence, unspecified, uncomplicated: Secondary | ICD-10-CM

## 2015-06-07 NOTE — Patient Instructions (Signed)

## 2015-06-07 NOTE — Progress Notes (Signed)
Subjective:      [name], MD, PhD [facility] [address]O. [place], [place] [address]  Dear Christopher Ray,   I saw your patient, Christopher Ray, upon your kind request, in my neurologic clinic today for initial consultation of his sleep disorder, in particular, concern for underlying obstructive sleep apnea. The patient is unaccompanied today. As you know, Christopher Ray is a 44 year old right-handed gentleman with an underlying medical history of diabetes, smoking, anxiety, back pain, degenerative lumbar spine disease and overweight state, who reports loud snoring and excessive daytime somnolence. His fiance has noted apneic breathing pauses. He has woken up with a sense of gasping and panic at times. He denies morning headaches or nocturia. Bedtime is around 9:30 or 10. Rise time is 6 AM. He does not wake up rested typically. His Epworth sleepiness score is 10 out of 24, his fatigue score is 24 out of 63. His father snored loudly. He has no family history of OSA. He is divorced and has 2 grown children, ages 30 and 51. He works for a [employer]. He lives with his fiance. He smokes one pack per day. He drinks Christopher Ray about 4 bottles per day, he has actually cut back. He denies restless leg symptoms. He likes to sleep on his back or sides.  He had a sleep study several years ago at [facility], this may have been in 2006. He was told that he had mild sleep apnea as he recalls. He did not pursue treatment at the time. Prior sleep test results are not available for my review today.  I reviewed your office note from 05/29/2015, which you kindly included.   His Past Medical History Is Significant For: Past Medical History  Diagnosis Date  . Chest pain     a. 08/2011 Echo: EF 60-65%, nl wall motion;  b. 08/2011 Normal ETT:  walked 8:19 w/o ST/T changes.  . Tobacco abuse   . Fatigue   . Pre-syncope   . Anxiety   . Adjustment disorder with  anxiety   . Back pain   . DDD (degenerative disc disease), lumbosacral   . Diabetes mellitus without complication (Christopher Ray)     His Past Surgical History Is Significant For: Past Surgical History  Procedure Laterality Date  . Finger surgery      L index     His Family History Is Significant For: Family History  Problem Relation Age of Onset  . Heart disease Father     father had CABG in 75's  . Heart attack Father     46s  . Diabetes      F, GM  . Colon cancer Neg Hx   . Prostate cancer Neg Hx     His Social History Is Significant For: Social History   Social History  . Marital Status: Married    Spouse Name: N/A  . Number of Children: 2  . Years of Education: N/A   Occupational History  . Heavy machine operator Christopher Ray   Social History Main Topics  . Smoking status: Current Every Day Smoker -- 1.00 packs/day for 17 years    Types: Cigarettes  . Smokeless tobacco: Never Used  . Alcohol Use: 0.6 oz/week    1 Cans of beer per week     Comment: rarely   . Drug Use: No  . Sexual Activity: Not Asked   Other Topics Concern  . None   Social History Narrative  Divorced, 2 children     His Allergies Are:  No Known Allergies:   His Current Medications Are:  Outpatient Encounter Prescriptions as of 06/07/2015  Medication Sig  . aspirin EC 81 MG tablet Take 81 mg by mouth.  Christopher Ray Kitchen atorvastatin (LIPITOR) 20 MG tablet Take 20 mg by mouth.  . diphenhydrAMINE (BENADRYL) 25 mg capsule Take 25 mg by mouth every 6 (six) hours as needed for sleep.  Christopher Ray Kitchen ibuprofen (ADVIL,MOTRIN) 200 MG tablet Take 200 mg by mouth.   No facility-administered encounter medications on file as of 06/07/2015.  :  Review of Systems:  Out of a complete 14 point review of systems, all are reviewed and negative with the exception of these symptoms as listed below:  Patient ID: Christopher Ray is a 44 y.o. male. Epworth Sleepiness Scale 0= would never doze 1= slight chance of dozing 2= moderate  chance of dozing 3= high chance of dozing  Sitting and reading:2 Watching TV:3 Sitting inactive in a public place (ex. Theater or meeting):2 As a passenger in a car for an hour without a break:0 Lying down to rest in the afternoon:1 Sitting and talking to someone:0 Sitting quietly after lunch (no alcohol):2 In a car, while stopped in traffic:0 Total: 10  HPI   Review of Systems  Eyes:       Blurred vision  Respiratory:       Snoring  Psychiatric/Behavioral:       Not enough sleep Decrease energy    Objective:  Neurologic Exam  Physical Exam Physical Examination:   Filed Vitals:   06/07/15 1514  BP: 120/84  Pulse: 89  Resp: 20   General Examination: The patient is a very pleasant 44 y.o. male in no acute distress. He appears well-developed and well-nourished and well groomed.   HEENT: Normocephalic, atraumatic, pupils are equal, round and reactive to light and accommodation. Funduscopic exam is normal with sharp disc margins noted. Extraocular tracking is good without limitation to gaze excursion or nystagmus noted. Normal smooth pursuit is noted. Hearing is grossly intact. Tympanic membranes are clear bilaterally. Face is symmetric with normal facial animation and normal facial sensation. Speech is clear with no dysarthria noted. There is no hypophonia. There is no lip, neck/head, jaw or voice tremor. Neck is supple with full range of passive and active motion. There are no carotid bruits on auscultation. Oropharynx exam reveals: mild mouth dryness, adequate dental hygiene and moderate airway crowding, due to narrow airway entry and redundant soft palate with larger uvula noted. Mallampati is class II. Tongue protrudes centrally and palate elevates symmetrically. Tonsils are 1+ in size/absent. Neck size is 16.5 inches. He has a mild overbite. Nasal inspection reveals no significant nasal mucosal bogginess or redness and no septal deviation.   Chest: Clear to auscultation  without wheezing, rhonchi or crackles noted.  Heart: S1+S2+0, regular and normal without murmurs, rubs or gallops noted.   Abdomen: Soft, non-tender and non-distended with normal bowel sounds appreciated on auscultation.  Extremities: There is no pitting edema in the distal lower extremities bilaterally. Pedal pulses are intact.  Skin: Warm and dry without trophic changes noted. There are no varicose veins.  Musculoskeletal: exam reveals no obvious joint deformities, tenderness or joint swelling or erythema.   Neurologically:  Mental status: The patient is awake, alert and oriented in all 4 spheres. His immediate and remote memory, attention, language skills and fund of knowledge are appropriate. There is no evidence of aphasia, agnosia, apraxia or anomia. Speech  is clear with normal prosody and enunciation. Thought process is linear. Mood is normal and affect is normal.  Cranial nerves II - XII are as described above under HEENT exam. In addition: shoulder shrug is normal with equal shoulder height noted. Motor exam: Normal bulk, strength and tone is noted. There is no drift, tremor or rebound. Romberg is negative. Reflexes are 2+ throughout. Babinski: Toes are flexor bilaterally. Fine motor skills and coordination: intact with normal finger taps, normal hand movements, normal rapid alternating patting, normal foot taps and normal foot agility.  Cerebellar testing: No dysmetria or intention tremor on finger to nose testing. Heel to shin is unremarkable bilaterally. There is no truncal or gait ataxia.  Sensory exam: intact to light touch, pinprick, vibration, temperature sense in the upper and lower extremities.  Gait, station and balance: He stands easily. No veering to one side is noted. No leaning to one side is noted. Posture is age-appropriate and stance is narrow based. Gait shows normal stride length and normal pace. No problems turning are noted. He turns en bloc. Tandem walk is  unremarkable.   Assessment and Plan:   In summary, ENSAR AUNGST is a very pleasant 44 y.o.-year old male with an underlying medical history of diabetes, smoking, anxiety, back pain, degenerative lumbar spine disease and overweight state, whose history and physical exam are concerning for obstructive sleep apnea (OSA). I had a long chat with the patient about my findings and the diagnosis of OSA, its prognosis and treatment options. We talked about medical treatments, surgical interventions and non-pharmacological approaches. I explained in particular the risks and ramifications of untreated moderate to severe OSA, especially with respect to developing cardiovascular disease down the Road, including congestive heart failure, difficult to treat hypertension, cardiac arrhythmias, or stroke. Even type 2 diabetes has, in part, been linked to untreated OSA. Symptoms of untreated OSA include daytime sleepiness, memory problems, mood irritability and mood disorder such as depression and anxiety, lack of energy, as well as recurrent headaches, especially morning headaches. We talked about smoking cessation and trying to maintain a healthy lifestyle in general, as well as the importance of weight control. I encouraged the patient to eat healthy, exercise daily and keep well hydrated, to keep a scheduled bedtime and wake time routine, to not skip any meals and eat healthy snacks in between meals. I advised the patient not to drive when feeling sleepy. I recommended the following at this time: sleep study with potential positive airway pressure titration. (We will score hypopneas at 3% and split the sleep study into diagnostic and treatment portion, if the estimated. 2 hour AHI is >15/h).   I explained the sleep test procedure to the patient and also outlined possible surgical and non-surgical treatment options of OSA, including the use of a custom-made dental device (which would require a referral to a specialist  dentist or oral surgeon), upper airway surgical options, such as pillar implants, radiofrequency surgery, tongue base surgery, and UPPP (which would involve a referral to an ENT surgeon). Rarely, jaw surgery such as mandibular advancement may be considered.  I also explained the CPAP treatment option to the patient, who indicated that he would be willing to try CPAP if the need arises. I explained the importance of being compliant with PAP treatment, not only for insurance purposes but primarily to improve His symptoms, and for the patient's long term health benefit, including to reduce His cardiovascular risks. I answered all his questions today and the patient was  in agreement. I would like to see him back after the sleep study is completed and encouraged him to call with any interim questions, concerns, problems or updates.   Thank you very much for allowing me to participate in the care of this nice patient. If I can be of any further assistance to you please do not hesitate to call me at [phone].  Sincerely,   [name], MD, PhD

## 2015-06-18 DIAGNOSIS — G4733 Obstructive sleep apnea (adult) (pediatric): Secondary | ICD-10-CM

## 2015-06-18 HISTORY — DX: Obstructive sleep apnea (adult) (pediatric): G47.33

## 2015-06-30 ENCOUNTER — Ambulatory Visit (INDEPENDENT_AMBULATORY_CARE_PROVIDER_SITE_OTHER): Payer: BLUE CROSS/BLUE SHIELD | Admitting: Neurology

## 2015-06-30 DIAGNOSIS — G4733 Obstructive sleep apnea (adult) (pediatric): Secondary | ICD-10-CM

## 2015-06-30 DIAGNOSIS — G4720 Circadian rhythm sleep disorder, unspecified type: Secondary | ICD-10-CM

## 2015-06-30 DIAGNOSIS — G4761 Periodic limb movement disorder: Secondary | ICD-10-CM

## 2015-06-30 DIAGNOSIS — G479 Sleep disorder, unspecified: Secondary | ICD-10-CM

## 2015-07-01 NOTE — Sleep Study (Signed)
Please see the scanned sleep study interpretation located in the procedure tab within the chart review section.   

## 2015-07-03 ENCOUNTER — Telehealth: Payer: Self-pay | Admitting: Neurology

## 2015-07-03 DIAGNOSIS — G4733 Obstructive sleep apnea (adult) (pediatric): Secondary | ICD-10-CM

## 2015-07-03 NOTE — Telephone Encounter (Signed)
Patient referred by Dr. Larose Kells, seen by me on 06/07/15, diagnostic PSG on 06/30/15, ins: [employer].   Please call and notify the patient that the recent sleep study did confirm the diagnosis of obstructive sleep apnea and given the loud snoring and his sleep related complaints, I recommend treatment for this in the form of CPAP. This will require a repeat sleep study for proper titration and mask fitting. Please explain to patient and arrange for a CPAP titration study. I have placed an order in the chart. Thanks, and please route to [facility] for scheduling next sleep study.  [name], MD, PhD [facility])

## 2015-07-04 NOTE — Telephone Encounter (Signed)
I spoke to patient and he is aware of results and recommendations. He would like to proceed with 2nd study.

## 2015-07-12 ENCOUNTER — Ambulatory Visit (INDEPENDENT_AMBULATORY_CARE_PROVIDER_SITE_OTHER): Payer: BLUE CROSS/BLUE SHIELD | Admitting: Neurology

## 2015-07-12 DIAGNOSIS — G4733 Obstructive sleep apnea (adult) (pediatric): Secondary | ICD-10-CM | POA: Diagnosis not present

## 2015-07-12 DIAGNOSIS — G4720 Circadian rhythm sleep disorder, unspecified type: Secondary | ICD-10-CM

## 2015-07-12 DIAGNOSIS — G4761 Periodic limb movement disorder: Secondary | ICD-10-CM

## 2015-07-13 NOTE — Sleep Study (Signed)
Please see the scanned sleep study interpretation located in the procedure tab within the chart review section.   

## 2015-07-18 ENCOUNTER — Telehealth: Payer: Self-pay | Admitting: Neurology

## 2015-07-18 DIAGNOSIS — G4733 Obstructive sleep apnea (adult) (pediatric): Secondary | ICD-10-CM

## 2015-07-18 NOTE — Telephone Encounter (Signed)
Patient referred by Dr. Larose Kells, seen by me on 06/07/15, diagnostic PSG on 06/30/15, CPAP study on 07/12/15, ins: [employer]. Please call and inform patient that I have entered an order for treatment with positive airway pressure (PAP) treatment of obstructive sleep apnea (OSA). He did well during the latest sleep study with CPAP. We will, therefore, arrange for a machine for home use through a DME (durable medical equipment) company of His choice; and I will see the patient back in follow-up in about 8-10 weeks. Please also explain to the patient that I will be looking out for compliance data, which can be downloaded from the machine (stored on an SD card, that is inserted in the machine) or via remote access through a modem, that is built into the machine. At the time of the followup appointment we will discuss sleep study results and how it is going with PAP treatment at home. Please advise patient to bring His machine at the time of the first FU visit, even though this is cumbersome. Bringing the machine for every visit after that will likely not be needed, but often helps for the first visit to troubleshoot if needed. Please re-enforce the importance of compliance with treatment and the need for [place] to monitor compliance data - often an insurance requirement and actually good feedback for the patient as far as how they are doing.  Also remind patient, that any interim PAP machine or mask issues should be first addressed with the DME company, as they can often help better with technical and mask fit issues. Please ask if patient has a preference regarding DME company.  Please also make sure, the patient has a follow-up appointment with me in about 8-10 weeks from the setup date, thanks.  Once you have spoken to the patient - and faxed/routed report to PCP and referring MD (if other than PCP), you can close this encounter, thanks,   [name], MD, PhD [facility] ([place])

## 2015-07-25 NOTE — Telephone Encounter (Signed)
LM to call back for results

## 2015-07-25 NOTE — Telephone Encounter (Signed)
I spoke to patient and he is aware of results and recommendations. He is willing to proceed with treatment. I will send orders to DME company. I will send report to PCP. I will also send the patient a letter reminding him to make appt and stress the importance of compliance.

## 2015-11-02 ENCOUNTER — Telehealth: Payer: Self-pay

## 2015-11-02 NOTE — Telephone Encounter (Signed)
I spoke to patient and made f/u appt with him. I offered sooner appt with NP but he wanted to see Dr. Rexene Alberts

## 2015-11-02 NOTE — Telephone Encounter (Signed)
Christopher Ray, this patient says he is having MUCH difficulty using CPAP and wants to know if he needs to come in for visit.  He is using it every night but in his sleep taking it off.  He request that you call him [phone].

## 2015-11-23 ENCOUNTER — Ambulatory Visit (INDEPENDENT_AMBULATORY_CARE_PROVIDER_SITE_OTHER): Payer: BLUE CROSS/BLUE SHIELD | Admitting: Neurology

## 2015-11-23 ENCOUNTER — Encounter: Payer: Self-pay | Admitting: Neurology

## 2015-11-23 VITALS — BP 132/88 | HR 82 | Resp 18 | Ht 67.0 in | Wt 194.0 lb

## 2015-11-23 DIAGNOSIS — Z9989 Dependence on other enabling machines and devices: Principal | ICD-10-CM

## 2015-11-23 DIAGNOSIS — Z720 Tobacco use: Secondary | ICD-10-CM

## 2015-11-23 DIAGNOSIS — G4733 Obstructive sleep apnea (adult) (pediatric): Secondary | ICD-10-CM

## 2015-11-23 DIAGNOSIS — F17200 Nicotine dependence, unspecified, uncomplicated: Secondary | ICD-10-CM

## 2015-11-23 NOTE — Progress Notes (Signed)
Subjective:    Patient ID: Christopher Ray is a 45 y.o. male.  HPI     Interim history:   Christopher Ray is a 45 year old right-handed gentleman with an underlying medical history of diabetes, smoking, anxiety, back pain, degenerative lumbar spine disease and overweight state, who presents for follow-up consultation of his obstructive sleep apnea, after his recent sleep studies. The patient is unaccompanied today. I first met him on 06/07/2015, at the request of his primary care provider, at which time he reported snoring and excessive daytime somnolence as well as witnessed apneas. He had a baseline sleep study, followed by a CPAP titration study. I went over his test results with him in detail today. His baseline sleep study from 06/30/2015 showed a sleep efficiency of 58.7% with a latency to sleep of 19 minutes and wake after sleep onset of 144.5 minutes. He was unable to go back to sleep after 3 AM. He had mild PLMS without arousals. He had an increased percentage of stage II sleep, slow-wave sleep was 10.7% and REM sleep was 7.5% with a normal REM latency. He had no significant EKG or EEG changes. Mild to loud snoring was noted. Total AHI was 10.3 per hour, rising to 24 per hour during REM sleep and 37.8 per hour in the supine position. Average oxygen saturation was 95%, nadir was 90%. Based on his sleep related complaints and his test results I invited him back for a full night CPAP titration study. He had this on 07/12/2015. Sleep efficiency was 94.9%, sleep latency 4 minutes, wake after sleep onset 16.5 minutes. He had an mildly increased percentage of stage II sleep, a mildly decreased percentage of slow-wave sleep, and a mildly increased percentage of REM sleep with a borderline reduced REM latency. He had mild PLMS with minimal arousals. He had no significant PVCs or EKG changes or EEG changes. Average oxygen saturation was 95%, nadir was 89%. CPAP was titrated from 5 cm to 11 cm. AHI was 0 per  hour at a pressure of 9 cm with supine REM sleep achieved. Based on his test results are prescribed CPAP therapy for home use.   Today, 11/23/2015: I reviewed his CPAP compliance data from 10/23/2015 through 11/21/2015 which is a total of 30 days during which time he used his machine 21 days with percent used days greater than 4 hours at 50%, indicating suboptimal compliance with an average usage for all days of 3 hours and 46 minutes, residual AHI 3.7 per hour, leak acceptable with the 95th percentile at 8.1 L/m on a pressure of 9 cm with EPR of 3.   Today, 11/23/2015: He reports doing okay, but struggles with CPAP, trying to keep it on at night. He does not feel great but has noted some improvements in his sleep including having more daytime energy and he no longer snores, sometimes the air leaks out of his mouth. In the interim, he was diagnosed and treated for [facility] spotted fever. He does have work-related stress and other stress, worries about his children but overall, he is motivated and committed to continue to try using it consistently. He was out of town around [place] Day which is why there were skip nights. Overall, he is agreeable to trying it more consistently. His brother has sleep apnea as well.   Previously:   06/07/2015: He reports loud snoring and excessive daytime somnolence. His fiance has noted apneic breathing pauses. He has woken up with a sense of gasping and panic  at times. He denies morning headaches or nocturia. Bedtime is around 9:30 or 10. Rise time is 6 AM. He does not wake up rested typically. His Epworth sleepiness score is 10 out of 24, his fatigue score is 24 out of 63. His father snored loudly. He has no family history of OSA. He is divorced and has 2 grown children, ages 21 and 27. He works for a [employer]. He lives with his fiance. He smokes one pack per day. He drinks [facility] about 4 bottles per day, he has actually cut back. He denies restless leg  symptoms. He likes to sleep on his back or sides.  He had a sleep study several years ago at [facility], this may have been in 2006. He was told that he had mild sleep apnea as he recalls. He did not pursue treatment at the time. Prior sleep test results are not available for my review today.  I reviewed your office note from 05/29/2015, which you kindly included.  His Past Medical History Is Significant For: Past Medical History  Diagnosis Date  . Chest pain     a. 08/2011 Echo: EF 60-65%, nl wall motion;  b. 08/2011 Normal ETT:  walked 8:19 w/o ST/T changes.  . Tobacco abuse   . Fatigue   . Pre-syncope   . Anxiety   . Adjustment disorder with anxiety   . Back pain   . DDD (degenerative disc disease), lumbosacral   . Diabetes mellitus without complication ([place])   . OSA (obstructive sleep apnea) 2017    Dr. Rexene Alberts    His Past Surgical History Is Significant For: Past Surgical History  Procedure Laterality Date  . Finger surgery      L index     His Family History Is Significant For: Family History  Problem Relation Age of Onset  . Heart disease Father     father had CABG in 35's  . Heart attack Father     8s  . Diabetes      F, GM  . Colon cancer Neg Hx   . Prostate cancer Neg Hx     His Social History Is Significant For: Social History   Social History  . Marital Status: Married    Spouse Name: N/A  . Number of Children: 2  . Years of Education: N/A   Occupational History  . Heavy machine operator [employer]   Social History Main Topics  . Smoking status: Current Every Day Smoker -- 1.00 packs/day for 17 years    Types: Cigarettes  . Smokeless tobacco: Never Used  . Alcohol Use: 0.6 oz/week    1 Cans of beer per week     Comment: rarely   . Drug Use: No  . Sexual Activity: Not Asked   Other Topics Concern  . None   Social History Narrative   Divorced, 2 children     His Allergies Are:  No Known Allergies:   His Current Medications  Are:  Outpatient Encounter Prescriptions as of 11/23/2015  Medication Sig  . aspirin EC 81 MG tablet Take 81 mg by mouth.  . diphenhydrAMINE (BENADRYL) 25 mg capsule Take 25 mg by mouth every 6 (six) hours as needed for sleep.  Marland Kitchen ibuprofen (ADVIL,MOTRIN) 200 MG tablet Take 200 mg by mouth.  . [DISCONTINUED] atorvastatin (LIPITOR) 20 MG tablet Take 20 mg by mouth.   No facility-administered encounter medications on file as of 11/23/2015.  :  Review of Systems:  Out of a complete 14 point review of systems, all are reviewed and negative with the exception of these symptoms as listed below:  Review of Systems  Neurological:       Patient has some financial questions with CPAP. Having a little trouble adjusting to it.     Objective:  Neurologic Exam  Physical Exam Physical Examination:   Filed Vitals:   11/23/15 1454  BP: 132/88  Pulse: 82  Resp: 18   General Examination: The patient is a very pleasant 45 y.o. male in no acute distress. He appears well-developed and well-nourished and well groomed. He is in good spirits today.  HEENT: Normocephalic, atraumatic, pupils are equal, round and reactive to light and accommodation. Extraocular tracking is good without limitation to gaze excursion or nystagmus noted. Normal smooth pursuit is noted. Hearing is grossly intact. Face is symmetric with normal facial animation and normal facial sensation. Speech is clear with no dysarthria noted. There is no hypophonia. There is no lip, neck/head, jaw or voice tremor. Neck is supple with full range of passive and active motion. There are no carotid bruits on auscultation. Oropharynx exam reveals: mild mouth dryness, adequate dental hygiene and moderate airway crowding, due to narrow airway entry and redundant soft palate with larger uvula noted. Mallampati is class II. Tongue protrudes centrally and palate elevates symmetrically. Tonsils are 1+ in size/absent.    Chest: Clear to auscultation without  wheezing, rhonchi or crackles noted.  Heart: S1+S2+0, regular and normal without murmurs, rubs or gallops noted.   Abdomen: Soft, non-tender and non-distended with normal bowel sounds appreciated on auscultation.  Extremities: There is no pitting edema in the distal lower extremities bilaterally. Pedal pulses are intact.  Skin: Warm and dry without trophic changes noted. There are no varicose veins.  Musculoskeletal: exam reveals no obvious joint deformities, tenderness or joint swelling or erythema.   Neurologically:  Mental status: The patient is awake, alert and oriented in all 4 spheres. His immediate and remote memory, attention, language skills and fund of knowledge are appropriate. There is no evidence of aphasia, agnosia, apraxia or anomia. Speech is clear with normal prosody and enunciation. Thought process is linear. Mood is normal and affect is normal.  Cranial nerves II - XII are as described above under HEENT exam. In addition: shoulder shrug is normal with equal shoulder height noted. Motor exam: Normal bulk, strength and tone is noted. There is no drift, tremor or rebound. Romberg is negative. Reflexes are 2+ throughout. Babinski: Toes are flexor bilaterally. Fine motor skills and coordination: intact with normal finger taps, normal hand movements, normal rapid alternating patting, normal foot taps and normal foot agility.  Cerebellar testing: No dysmetria or intention tremor on finger to nose testing.   Sensory exam: intact to light touch in the upper and lower extremities.  Gait, station and balance: He stands easily. No veering to one side is noted. No leaning to one side is noted. Posture is age-appropriate and stance is narrow based. Gait shows normal stride length and normal pace. No problems turning are noted. He turns en bloc. Tandem walk is unremarkable.   Assessment and Plan:   In summary, Christopher Ray is a very pleasant 45 year old male with an underlying medical  history of diabetes, smoking, anxiety, back pain, degenerative lumbar spine disease and overweight state, Presents for follow-up consultation of his obstructive sleep apnea, now on CPAP therapy. He had a baseline sleep study and a CPAP titration study in January 2017.  we talked about his to sleep study results in detail today. He has mild to moderate obstructive sleep apnea. He is still struggling with CPAP compliance. He is advised to try to keep the mask on as much is possible and not to skip any nights of possible. We talked about mild to moderate obstructive sleep apnea and overall, he does endorse some improvements in his sleep and the snoring is essentially gone. He is committed to trying it longer and using it long-term. He had some additional questions about the machine and the settings which I answered. He has some problem with his nasal mask at this time and I have suggested he get in touch with his DME company and gave him contact numbers. His physical exam is stable. He is advised to quit smoking. We talked about maintaining a healthy lifestyle in general. We talked about symptoms of untreated OSA. He is encouraged to be more compliant with therapy. He is agreeable and motivated to do so. I would like to see him back in 6 months, sooner if needed. I answered all his questions today and he was in agreement. I spent 25 minutes in total face-to-face time with the patient, more than 50% of which was spent in counseling and coordination of care, reviewing test results, reviewing medication and discussing or reviewing the diagnosis of OSA, its prognosis and treatment options.

## 2015-11-23 NOTE — Patient Instructions (Signed)
Please continue using your CPAP regularly. While your insurance requires that you use CPAP at least 4 hours each night on 70% of the nights, I recommend, that you not skip any nights and use it throughout the night if you can. Getting used to CPAP and staying with the treatment long term does take time and patience and discipline. Untreated obstructive sleep apnea when it is moderate to severe can have an adverse impact on cardiovascular health and raise her risk for heart disease, arrhythmias, hypertension, congestive heart failure, stroke and diabetes. Untreated obstructive sleep apnea causes sleep disruption, nonrestorative sleep, and sleep deprivation. This can have an impact on your day to day functioning and cause daytime sleepiness and impairment of cognitive function, memory loss, mood disturbance, and problems focussing. Using CPAP regularly can improve these symptoms.  Please remember to try to maintain good sleep hygiene, which means: Keep a regular sleep and wake schedule, try not to exercise or have a meal within 2 hours of your bedtime, try to keep your bedroom conducive for sleep, that is, cool and dark, without light distractors such as an illuminated alarm clock, and refrain from watching TV right before sleep or in the middle of the night and do not keep the TV or radio on during the night. Also, try not to use or play on electronic devices at bedtime, such as your cell phone, tablet PC or laptop. If you like to read at bedtime on an electronic device, try to dim the background light as much as possible. Do not eat in the middle of the night.    You can try Melatonin at night for sleep: take 1 mg to 3 mg, one to 2 hours before your bedtime. You can go up to 5 mg if needed. It is over the counter and comes in pill form, chewable form and spray, if you prefer.    Follow up in 6 mo.

## 2016-05-29 ENCOUNTER — Ambulatory Visit: Payer: BLUE CROSS/BLUE SHIELD | Admitting: Neurology

## 2021-01-09 ENCOUNTER — Encounter: Payer: Self-pay | Admitting: Gastroenterology

## 2021-02-23 ENCOUNTER — Ambulatory Visit (AMBULATORY_SURGERY_CENTER): Payer: BC Managed Care – PPO | Admitting: *Deleted

## 2021-02-23 ENCOUNTER — Other Ambulatory Visit: Payer: Self-pay

## 2021-02-23 VITALS — Ht 67.0 in | Wt 175.0 lb

## 2021-02-23 DIAGNOSIS — Z1211 Encounter for screening for malignant neoplasm of colon: Secondary | ICD-10-CM

## 2021-02-23 MED ORDER — PEG-KCL-NACL-NASULF-NA ASC-C 100 G PO SOLR: 1.0000 | Freq: Once | ORAL | 0 refills | Status: AC

## 2021-02-23 NOTE — Progress Notes (Signed)
Patient's pre-visit was done today over the phone with the patient due to COVID-19 pandemic. Name,DOB and address verified. Patient denies any allergies to Eggs and Soy. Patient denies any problems with anesthesia/sedation. Patient is not taking any diet pills or blood thinners. No home Oxygen. Packet of Prep instructions mailed to patient including a copy of a consent form-pt is aware. Patient understands to call us back with any questions or concerns. Patient is aware of our care-partner policy and 0000000 safety protocol. Colonoscopy pamphlet mailed to pt.

## 2021-03-04 ENCOUNTER — Other Ambulatory Visit: Payer: Self-pay | Admitting: Nurse Practitioner

## 2021-03-04 ENCOUNTER — Telehealth: Payer: Self-pay | Admitting: Nurse Practitioner

## 2021-03-04 NOTE — Telephone Encounter (Signed)
Hines contacted answering service. Insurance will not cover prep. Spoke with [employer]. Golytely is only prep insurance will cover. I will send in a prescription

## 2021-03-05 ENCOUNTER — Other Ambulatory Visit: Payer: Self-pay

## 2021-03-05 ENCOUNTER — Telehealth: Payer: Self-pay | Admitting: Gastroenterology

## 2021-03-05 DIAGNOSIS — Z1211 Encounter for screening for malignant neoplasm of colon: Secondary | ICD-10-CM

## 2021-03-05 MED ORDER — PEG 3350-KCL-NA BICARB-NACL 420 G PO SOLR: 4000.0000 mL | Freq: Once | ORAL | 0 refills | Status: AC

## 2021-03-05 NOTE — Telephone Encounter (Signed)
Spoke with the patient. Golytely Rx sent to pharmacy and I explained to him how to drink this prep and to get the dulcolax today to take at 3 pm today. Pt aware.

## 2021-03-06 ENCOUNTER — Ambulatory Visit (AMBULATORY_SURGERY_CENTER): Payer: BC Managed Care – PPO | Admitting: Gastroenterology

## 2021-03-06 ENCOUNTER — Other Ambulatory Visit: Payer: Self-pay

## 2021-03-06 ENCOUNTER — Encounter: Payer: Self-pay | Admitting: Gastroenterology

## 2021-03-06 VITALS — BP 98/72 | HR 68 | Temp 97.3°F | Resp 17 | Ht 67.0 in | Wt 175.0 lb

## 2021-03-06 DIAGNOSIS — Z1211 Encounter for screening for malignant neoplasm of colon: Secondary | ICD-10-CM

## 2021-03-06 DIAGNOSIS — K635 Polyp of colon: Secondary | ICD-10-CM | POA: Diagnosis not present

## 2021-03-06 DIAGNOSIS — D123 Benign neoplasm of transverse colon: Secondary | ICD-10-CM

## 2021-03-06 MED ORDER — SODIUM CHLORIDE 0.9 % IV SOLN
500.0000 mL | Freq: Once | INTRAVENOUS | Status: DC
Start: 2021-03-06 — End: 2021-03-06

## 2021-03-06 NOTE — Progress Notes (Signed)
[facility] History and Physical   Primary Care Physician:  Berkley Harvey, NP   Reason for Procedure:   Colon cancer screening  Plan:    Screening colonoscopy     HPI: Christopher Ray is a 50 y.o. male here for initial average risk screening colonoscopy.  He has no chronic GI symptoms and no family history of colon cancer   Past Medical History:  Diagnosis Date   Adjustment disorder with anxiety    Anxiety    Back pain    Chest pain    a. 08/2011 Echo: EF 60-65%, nl wall motion;  b. 08/2011 Normal ETT:  walked 8:19 w/o ST/T changes.   DDD (degenerative disc disease), lumbosacral    Diabetes mellitus without complication (H[facility])    Fatigue    OSA (obstructive sleep apnea) 06/18/2015   Dr. Rexene Alberts   Pre-syncope    Smoker    Tobacco abuse     Past Surgical History:  Procedure Laterality Date   FINGER SURGERY     L index     Prior to Admission medications   Medication Sig Start Date End Date Taking? Authorizing Provider  aspirin EC 81 MG tablet Take 81 mg by mouth 3 (three) times a week.   Yes [provider]  metFORMIN (GLUCOPHAGE-XR) 500 MG 24 hr tablet Take by mouth. 02/09/21  Yes [provider]  Multiple Vitamins-Minerals (MENS MULTIVITAMIN PO) Take by mouth.   Yes [provider]  Omega-3 1000 MG CAPS Take by mouth.   Yes [provider]    Current Outpatient Medications  Medication Sig Dispense Refill   aspirin EC 81 MG tablet Take 81 mg by mouth 3 (three) times a week.     metFORMIN (GLUCOPHAGE-XR) 500 MG 24 hr tablet Take by mouth.     Multiple Vitamins-Minerals (MENS MULTIVITAMIN PO) Take by mouth.     Omega-3 1000 MG CAPS Take by mouth.     Current Facility-Administered Medications  Medication Dose Route Frequency Provider Last Rate Last Admin   0.9 %  sodium chloride infusion  500 mL Intravenous Once Daryel November, MD        Allergies as of 03/06/2021   (No Known Allergies)    Family History   Problem Relation Age of Onset   Heart disease Father        father had CABG in 33's   Heart attack Father        86s   Diabetes Other        F, GM   Colon cancer Neg Hx    Prostate cancer Neg Hx    Colon polyps Neg Hx    Esophageal cancer Neg Hx    Rectal cancer Neg Hx    Stomach cancer Neg Hx     Social History   Socioeconomic History   Marital status: Married    Spouse name: Not on file   Number of children: 2   Years of education: Not on file   Highest education level: Not on file  Occupational History   Occupation: Heavy Glass blower/designer    Employer: [employer]  Tobacco Use   Smoking status: Every Day    Packs/day: 1.00    Years: 17.00    Pack years: 17.00    Types: Cigarettes   Smokeless tobacco: Never  Vaping Use   Vaping Use: Never used  Substance and Sexual Activity   Alcohol use: Not Currently    Comment: occ  Drug use: No   Sexual activity: Not on file  Other Topics Concern   Not on file  Social History Narrative   Divorced, 2 children    Social Determinants of Health   Financial Resource Strain: Not on file  Food Insecurity: Not on file  Transportation Needs: Not on file  Physical Activity: Not on file  Stress: Not on file  Social Connections: Not on file  Intimate Partner Violence: Not on file    Review of Systems:  All other review of systems negative except as mentioned in the HPI.  Physical Exam: Vital signs BP 125/69   Pulse 88   Temp (!) 97.3 F (36.3 C) (Skin)   Ht 5\' 7"  (1.702 m)   Wt 175 lb (79.4 kg)   SpO2 99%   BMI 27.41 kg/m   General:   Alert,  Well-developed, well-nourished, pleasant and cooperative in NAD.  MP3 Lungs:  Clear throughout to auscultation.   Heart:  Regular rate and rhythm; no murmurs, clicks, rubs,  or gallops. Abdomen:  Soft, nontender and nondistended. Normal bowel sounds.   Neuro/Psych:  Normal mood and affect. A and O x 3   Colum Colt E. Candis Schatz, MD [facility]

## 2021-03-06 NOTE — Progress Notes (Signed)
Pt's states no medical or surgical changes since previsit or office visit. VS assessed by D.T 

## 2021-03-06 NOTE — Op Note (Signed)
[place] Patient Name: Christopher Ray Procedure Date: 03/06/2021 10:56 AM MRN: [id number] Endoscopist: Nicki Reaper E. Candis Schatz , MD Age: 50 Referring MD:  Date of Birth: 10/25/1970 Gender: Male Account #: [id number] Procedure:                Colonoscopy Indications:              Screening for colorectal malignant neoplasm, This                            is the patient's first colonoscopy Medicines:                Monitored Anesthesia Care Procedure:                Pre-Anesthesia Assessment:                           - Prior to the procedure, a History and Physical                            was performed, and patient medications and                            allergies were reviewed. The patient's tolerance of                            previous anesthesia was also reviewed. The risks                            and benefits of the procedure and the sedation                            options and risks were discussed with the patient.                            All questions were answered, and informed consent                            was obtained. Prior Anticoagulants: The patient has                            taken no previous anticoagulant or antiplatelet                            agents except for aspirin. ASA Grade Assessment:                            III - A patient with severe systemic disease. After                            reviewing the risks and benefits, the patient was                            deemed in satisfactory condition to undergo the  procedure.                           After obtaining informed consent, the colonoscope                            was passed under direct vision. Throughout the                            procedure, the patient's blood pressure, pulse, and                            oxygen saturations were monitored continuously. The                            Olympus CF-HQ190L (Serial# [id number]) Colonoscope was                             introduced through the anus and advanced to the the                            cecum, identified by appendiceal orifice and                            ileocecal valve. The colonoscopy was performed                            without difficulty. The patient tolerated the                            procedure well. The quality of the bowel                            preparation was fair in that the right colon had                            copious amounts of thick, adherent liquid stool                            which in some areas did not rinse completely. The                            ileocecal valve, appendiceal orifice, and rectum                            were photographed. The bowel preparation used was                            GoLYTELY via split dose instruction. Scope In: 11:11:59 AM Scope Out: 11:32:05 AM Scope Withdrawal Time: 0 hours 17 minutes 30 seconds  Total Procedure Duration: 0 hours 20 minutes 6 seconds  Findings:                 The perianal and digital rectal examinations were  normal. Pertinent negatives include normal                            sphincter tone and no palpable rectal lesions.                           A 3 mm polyp was found in the transverse colon. The                            polyp was sessile. The polyp was removed with a                            cold snare. Resection and retrieval were complete.                            Estimated blood loss was minimal.                           The exam was otherwise normal throughout the                            examined colon.                           The retroflexed view of the distal rectum and anal                            verge was normal and showed no anal or rectal                            abnormalities. Complications:            No immediate complications. Estimated Blood Loss:     Estimated blood loss was minimal. Impression:                - Preparation of the colon was fair.                           - One 3 mm polyp in the transverse colon, removed                            with a cold snare. Resected and retrieved.                           - The distal rectum and anal verge are normal on                            retroflexion view. Recommendation:           - Patient has a contact number available for                            emergencies. The signs and symptoms of potential  delayed complications were discussed with the                            patient. Return to normal activities tomorrow.                            Written discharge instructions were provided to the                            patient.                           - Resume previous diet.                           - Continue present medications.                           - Await pathology results.                           - Repeat colonoscopy in 3 years because the bowel                            preparation was suboptimal. Recommend alternative                            prep with next colonoscopy (not GoLytely). Sherece Gambrill E. Candis Schatz, MD 03/06/2021 11:44:52 AM This report has been signed electronically.

## 2021-03-06 NOTE — Progress Notes (Signed)
Called to room to assist during endoscopic procedure.  Patient ID and intended procedure confirmed with present staff. Received instructions for my participation in the procedure from the performing physician.  

## 2021-03-06 NOTE — Patient Instructions (Signed)
Please read handouts provided. Continue present medications. Await pathology results. Recommending repeat colonoscopy in 3 years.   YOU HAD AN ENDOSCOPIC PROCEDURE TODAY AT [facility]:   Refer to the procedure report that was given to you for any specific questions about what was found during the examination.  If the procedure report does not answer your questions, please call your gastroenterologist to clarify.  If you requested that your care partner not be given the details of your procedure findings, then the procedure report has been included in a sealed envelope for you to review at your convenience later.  YOU SHOULD EXPECT: Some feelings of bloating in the abdomen. Passage of more gas than usual.  Walking can help get rid of the air that was put into your GI tract during the procedure and reduce the bloating. If you had a lower endoscopy (such as a colonoscopy or flexible sigmoidoscopy) you may notice spotting of blood in your stool or on the toilet paper. If you underwent a bowel prep for your procedure, you may not have a normal bowel movement for a few days.  Please Note:  You might notice some irritation and congestion in your nose or some drainage.  This is from the oxygen used during your procedure.  There is no need for concern and it should clear up in a day or so.  SYMPTOMS TO REPORT IMMEDIATELY:  Following lower endoscopy (colonoscopy or flexible sigmoidoscopy):  Excessive amounts of blood in the stool  Significant tenderness or worsening of abdominal pains  Swelling of the abdomen that is new, acute  Fever of 100F or higher   For urgent or emergent issues, a gastroenterologist can be reached at any hour by calling [phone]. Do not use MyChart messaging for urgent concerns.    DIET:  We do recommend a small meal at first, but then you may proceed to your regular diet.  Drink plenty of fluids but you should avoid alcoholic beverages for 24  hours.  ACTIVITY:  You should plan to take it easy for the rest of today and you should NOT DRIVE or use heavy machinery until tomorrow (because of the sedation medicines used during the test).    FOLLOW UP: Our staff will call the number listed on your records 48-72 hours following your procedure to check on you and address any questions or concerns that you may have regarding the information given to you following your procedure. If we do not reach you, we will leave a message.  We will attempt to reach you two times.  During this call, we will ask if you have developed any symptoms of COVID 19. If you develop any symptoms (ie: fever, flu-like symptoms, shortness of breath, cough etc.) before then, please call [phone].  If you test positive for Covid 19 in the 2 weeks post procedure, please call and report this information to [place].    If any biopsies were taken you will be contacted by phone or by letter within the next 1-3 weeks.  Please call us at [phone] if you have not heard about the biopsies in 3 weeks.    SIGNATURES/CONFIDENTIALITY: You and/or your care partner have signed paperwork which will be entered into your electronic medical record.  These signatures attest to the fact that that the information above on your After Visit Summary has been reviewed and is understood.  Full responsibility of the confidentiality of this discharge information lies with you and/or your care-partner.

## 2021-03-06 NOTE — Progress Notes (Signed)
Vss nad transferred to pacu 

## 2021-03-08 ENCOUNTER — Telehealth: Payer: Self-pay

## 2021-03-08 NOTE — Telephone Encounter (Signed)
  Follow up Call-  Call back number 03/06/2021  Post procedure Call Back phone  # [phone]  Permission to leave phone message Yes  Some recent data might be hidden     Patient questions:  Do you have a fever, pain , or abdominal swelling? No. Pain Score  0 *  Have you tolerated food without any problems? Yes.    Have you been able to return to your normal activities? Yes.    Do you have any questions about your discharge instructions: Diet   No. Medications  No. Follow up visit  No.  Do you have questions or concerns about your Care? No.  Actions: * If pain score is 4 or above: No action needed, pain <4.

## 2021-03-15 ENCOUNTER — Encounter: Payer: Self-pay | Admitting: Gastroenterology
